# Patient Record
Sex: Female | Born: 2000 | Race: White | Hispanic: No | Marital: Single | Smoking: Current every day smoker
Health system: Southern US, Community
[De-identification: ages and names within clinical notes are randomized; demographics above are authoritative.]

## PROBLEM LIST (undated history)

## (undated) DIAGNOSIS — J45909 Unspecified asthma, uncomplicated: Secondary | ICD-10-CM

---
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
## 2019-04-25 ENCOUNTER — Encounter (HOSPITAL_BASED_OUTPATIENT_CLINIC_OR_DEPARTMENT_OTHER): Payer: Self-pay | Admitting: *Deleted

## 2019-04-25 ENCOUNTER — Other Ambulatory Visit: Payer: Self-pay

## 2019-04-25 ENCOUNTER — Emergency Department (HOSPITAL_BASED_OUTPATIENT_CLINIC_OR_DEPARTMENT_OTHER)
Admission: EM | Admit: 2019-04-25 | Discharge: 2019-04-26 | Disposition: A | Discharge status: Home / Self Care | Payer: Managed Care, Other (non HMO) | Attending: Emergency Medicine | Admitting: Emergency Medicine

## 2019-04-25 ENCOUNTER — Emergency Department (HOSPITAL_BASED_OUTPATIENT_CLINIC_OR_DEPARTMENT_OTHER): Payer: Managed Care, Other (non HMO)

## 2019-04-25 DIAGNOSIS — J9801 Acute bronchospasm: Secondary | ICD-10-CM | POA: Diagnosis not present

## 2019-04-25 DIAGNOSIS — J45909 Unspecified asthma, uncomplicated: Secondary | ICD-10-CM | POA: Insufficient documentation

## 2019-04-25 DIAGNOSIS — F17210 Nicotine dependence, cigarettes, uncomplicated: Secondary | ICD-10-CM | POA: Insufficient documentation

## 2019-04-25 DIAGNOSIS — R0602 Shortness of breath: Secondary | ICD-10-CM | POA: Diagnosis present

## 2019-04-25 HISTORY — DX: Unspecified asthma, uncomplicated: J45.909

## 2019-04-25 LAB — CBC WITH DIFFERENTIAL/PLATELET
Abs Immature Granulocytes: 0.05 10*3/uL (ref 0.00–0.07)
Basophils Absolute: 0.1 10*3/uL (ref 0.0–0.1)
Basophils Relative: 1 %
Eosinophils Absolute: 1.1 10*3/uL — ABNORMAL HIGH (ref 0.0–0.5)
Eosinophils Relative: 9 %
HCT: 44.7 % (ref 36.0–46.0)
Hemoglobin: 15 g/dL (ref 12.0–15.0)
Immature Granulocytes: 0 %
Lymphocytes Relative: 36 %
Lymphs Abs: 4.4 10*3/uL — ABNORMAL HIGH (ref 0.7–4.0)
MCH: 29.2 pg (ref 26.0–34.0)
MCHC: 33.6 g/dL (ref 30.0–36.0)
MCV: 87.1 fL (ref 80.0–100.0)
Monocytes Absolute: 0.8 10*3/uL (ref 0.1–1.0)
Monocytes Relative: 7 %
Neutro Abs: 5.7 10*3/uL (ref 1.7–7.7)
Neutrophils Relative %: 47 %
Platelets: 255 10*3/uL (ref 150–400)
RBC: 5.13 MIL/uL — ABNORMAL HIGH (ref 3.87–5.11)
RDW: 12.3 % (ref 11.5–15.5)
WBC: 12.1 10*3/uL — ABNORMAL HIGH (ref 4.0–10.5)
nRBC: 0 % (ref 0.0–0.2)

## 2019-04-25 LAB — BASIC METABOLIC PANEL
Anion gap: 13 (ref 5–15)
BUN: 13 mg/dL (ref 6–20)
CO2: 22 mmol/L (ref 22–32)
Calcium: 9.4 mg/dL (ref 8.9–10.3)
Chloride: 103 mmol/L (ref 98–111)
Creatinine, Ser: 0.78 mg/dL (ref 0.44–1.00)
GFR calc Af Amer: >60 mL/min (ref >60)
GFR calc non Af Amer: >60 mL/min (ref >60)
Glucose, Bld: 94 mg/dL (ref 70–99)
Potassium: 3.6 mmol/L (ref 3.5–5.1)
Sodium: 138 mmol/L (ref 135–145)

## 2019-04-25 MED ORDER — MAGNESIUM SULFATE 2 GM/50ML IV SOLN
2.0000 g | Freq: Once | INTRAVENOUS | Status: AC
  Administered 2019-04-25: 2 g via INTRAVENOUS
  Filled 2019-04-25: qty 50

## 2019-04-25 MED ORDER — METHYLPREDNISOLONE SODIUM SUCC 125 MG IJ SOLR
125.0000 mg | Freq: Once | INTRAMUSCULAR | Status: AC
  Administered 2019-04-25: 125 mg via INTRAVENOUS
  Filled 2019-04-25: qty 2

## 2019-04-25 MED ORDER — LORAZEPAM 2 MG/ML IJ SOLN
1.0000 mg | Freq: Once | INTRAMUSCULAR | Status: AC
  Administered 2019-04-25: 1 mg via INTRAVENOUS
  Filled 2019-04-25: qty 1

## 2019-04-25 MED ORDER — ALBUTEROL SULFATE HFA 108 (90 BASE) MCG/ACT IN AERS
INHALATION_SPRAY | RESPIRATORY_TRACT | Status: AC
  Administered 2019-04-25: 8 via RESPIRATORY_TRACT
  Filled 2019-04-25: qty 6.7

## 2019-04-25 MED ORDER — ALBUTEROL SULFATE HFA 108 (90 BASE) MCG/ACT IN AERS
8.0000 | INHALATION_SPRAY | Freq: Once | RESPIRATORY_TRACT | Status: AC
  Administered 2019-04-25: 8 via RESPIRATORY_TRACT

## 2019-04-25 NOTE — ED Provider Notes (Signed)
[facility] Provider Note: Mary Spurling, MD, FACEP  CSN: [id number] MRN: [id number] ARRIVAL: 04/25/19 at 2315 ROOM: [place] (asthma)   HISTORY OF PRESENT ILLNESS  04/25/19 11:29 PM Mary Serrano is a 18 y.o. female with a history of asthma.  She has had shortness of breath for the past month, worse over the past 3 days.  She is not on any of her own medications for asthma but has been using a friend's albuterol.  She has been using the inhaler hourly without adequate relief.  Her symptoms have been severe at times.  She has had associated cough with posttussive emesis.  She also has a headache.  She has had no known COVID-19 exposures.  On arrival she was noted to be tachypneic with a respiratory rate of 30 and tachycardic with a pulse rate of 120.   Past Medical History:  Diagnosis Date  . Asthma     History reviewed. No pertinent surgical history.  No family history on file.  Social History   Tobacco Use  . Smoking status: Current Every Day Smoker    Types: Cigarettes  . Smokeless tobacco: Never Used  Substance Use Topics  . Alcohol use: Yes  . Drug use: No    Prior to Admission medications   Medication Sig Start Date End Date Taking? Authorizing Provider  predniSONE (DELTASONE) 50 MG tablet Take 1 tablet (50 mg total) by mouth daily. 04/26/19   Keir Viernes, MD  albuterol (PROVENTIL HFA;VENTOLIN HFA) 108 (90 BASE) MCG/ACT inhaler Inhale 2 puffs into the lungs every 4 (four) hours as needed for wheezing or shortness of breath. 07/26/13 04/26/19  Ward, Delice Bison, DO    Allergies Patient has no known allergies.   REVIEW OF SYSTEMS  Negative except as noted here or in the History of Present Illness.   PHYSICAL EXAMINATION  Initial Vital Signs Pulse (!) 120, temperature 98 F (36.7 C), temperature source Oral, resp. rate (!) 30, height 5\' 4"  (1.626 m), weight 81.6 kg, SpO2 97 %.  Examination General:  Well-developed, well-nourished female in mild distress; appearance consistent with age of record HENT: normocephalic; atraumatic Eyes: pupils equal, round and reactive to light; extraocular muscles intact Neck: supple Heart: regular rate and rhythm; tachycardia Lungs: clear to auscultation bilaterally but shallow breaths; tachypnea Abdomen: soft; nondistended; nontender; bowel sounds present Extremities: No deformity; full range of motion Neurologic: Awake, alert and oriented; motor function intact in all extremities and symmetric; no facial droop Skin: Warm and dry Psychiatric: Anxious   RESULTS  Summary of this visit's results, reviewed by myself:   EKG Interpretation  Date/Time:    Ventricular Rate:    PR Interval:    QRS Duration:   QT Interval:    QTC Calculation:   R Axis:     Text Interpretation:        Laboratory Studies: Results for orders placed or performed during the hospital encounter of 04/25/19 (from the past 24 hour(s))  CBC with Differential/Platelet     Status: Abnormal   Collection Time: 04/25/19 11:35 PM  Result Value Ref Range   WBC 12.1 (H) 4.0 - 10.5 K/uL   RBC 5.13 (H) 3.87 - 5.11 MIL/uL   Hemoglobin 15.0 12.0 - 15.0 g/dL   HCT 44.7 36.0 - 46.0 %   MCV 87.1 80.0 - 100.0 fL   MCH 29.2 26.0 - 34.0 pg   MCHC 33.6 30.0 - 36.0 g/dL   RDW  12.3 11.5 - 15.5 %   Platelets 255 150 - 400 K/uL   nRBC 0.0 0.0 - 0.2 %   Neutrophils Relative % 47 %   Neutro Abs 5.7 1.7 - 7.7 K/uL   Lymphocytes Relative 36 %   Lymphs Abs 4.4 (H) 0.7 - 4.0 K/uL   Monocytes Relative 7 %   Monocytes Absolute 0.8 0.1 - 1.0 K/uL   Eosinophils Relative 9 %   Eosinophils Absolute 1.1 (H) 0.0 - 0.5 K/uL   Basophils Relative 1 %   Basophils Absolute 0.1 0.0 - 0.1 K/uL   Immature Granulocytes 0 %   Abs Immature Granulocytes 0.05 0.00 - 0.07 K/uL  Basic metabolic panel     Status: None   Collection Time: 04/25/19 11:35 PM  Result Value Ref Range   Sodium 138 135 - 145 mmol/L    Potassium 3.6 3.5 - 5.1 mmol/L   Chloride 103 98 - 111 mmol/L   CO2 22 22 - 32 mmol/L   Glucose, Bld 94 70 - 99 mg/dL   BUN 13 6 - 20 mg/dL   Creatinine, Ser 1.610.78 0.44 - 1.00 mg/dL   Calcium 9.4 8.9 - 09.610.3 mg/dL   GFR calc non Af Amer >60 >60 mL/min   GFR calc Af Amer >60 >60 mL/min   Anion gap 13 5 - 15  Pregnancy, urine     Status: None   Collection Time: 04/25/19 11:35 PM  Result Value Ref Range   Preg Test, Ur NEGATIVE NEGATIVE   Imaging Studies: Dg Chest 2 View  Result Date: 04/26/2019 CLINICAL DATA:  Shortness of breath EXAM: CHEST - 2 VIEW COMPARISON:  None. FINDINGS: The heart size and mediastinal contours are within normal limits. Both lungs are clear. The visualized skeletal structures are unremarkable. IMPRESSION: No active cardiopulmonary disease. Electronically Signed   By: Jasmine PangKim  Fujinaga M.D.   On: 04/26/2019 00:06    ED COURSE and MDM  Nursing notes and initial vitals signs, including pulse oximetry, reviewed.  Vitals:   04/25/19 2325 04/25/19 2330 04/25/19 2338 04/26/19 0045  BP:  (!) 122/96    Pulse:  (!) 124  (!) 105  Resp:  (!) 22  (!) 23  Temp:      TempSrc:      SpO2:  95% 99% 96%  Weight: 81.6 kg     Height: 5\' 4"  (1.626 m)      12:55 AM Lungs clear, air movement improved, tachypnea resolved.  Patient states she is ready to go home.  She was given an inhaler and instructed in its use.  PROCEDURES    ED DIAGNOSES     ICD-10-CM   1. Acute bronchospasm  J98.01        Jaye Saal, Jonny RuizJohn, MD 04/26/19 [phone]

## 2019-04-25 NOTE — ED Triage Notes (Addendum)
Pt reports Hx of asthma. Has been using her friend's inhalers and nebulizer. States she has been SOB x 1 month but worse over the last 3 days. She is not on any maintenance medications for her asthma. States she vomited x 3 tonight after a coughing episode

## 2019-04-26 LAB — PREGNANCY, URINE: Preg Test, Ur: NEGATIVE

## 2019-04-26 MED ORDER — ONDANSETRON HCL 4 MG/2ML IJ SOLN: 4.0000 mg | Freq: Once | INTRAMUSCULAR | Status: DC

## 2019-04-26 MED ORDER — PREDNISONE 50 MG PO TABS: 50.0000 mg | ORAL_TABLET | Freq: Every day | ORAL | 0 refills | Status: DC

## 2019-05-23 ENCOUNTER — Encounter (HOSPITAL_BASED_OUTPATIENT_CLINIC_OR_DEPARTMENT_OTHER): Payer: Self-pay | Admitting: Adult Health

## 2019-05-23 ENCOUNTER — Other Ambulatory Visit: Payer: Self-pay

## 2019-05-23 ENCOUNTER — Emergency Department (HOSPITAL_BASED_OUTPATIENT_CLINIC_OR_DEPARTMENT_OTHER)
Admission: EM | Admit: 2019-05-23 | Discharge: 2019-05-23 | Disposition: A | Discharge status: Home / Self Care | Payer: Managed Care, Other (non HMO) | Attending: Emergency Medicine | Admitting: Emergency Medicine

## 2019-05-23 DIAGNOSIS — R0602 Shortness of breath: Secondary | ICD-10-CM | POA: Insufficient documentation

## 2019-05-23 DIAGNOSIS — J029 Acute pharyngitis, unspecified: Secondary | ICD-10-CM | POA: Diagnosis not present

## 2019-05-23 DIAGNOSIS — Z20828 Contact with and (suspected) exposure to other viral communicable diseases: Secondary | ICD-10-CM | POA: Diagnosis not present

## 2019-05-23 DIAGNOSIS — F17210 Nicotine dependence, cigarettes, uncomplicated: Secondary | ICD-10-CM | POA: Diagnosis not present

## 2019-05-23 DIAGNOSIS — J4521 Mild intermittent asthma with (acute) exacerbation: Secondary | ICD-10-CM | POA: Diagnosis not present

## 2019-05-23 DIAGNOSIS — R05 Cough: Secondary | ICD-10-CM | POA: Diagnosis not present

## 2019-05-23 DIAGNOSIS — R0981 Nasal congestion: Secondary | ICD-10-CM | POA: Insufficient documentation

## 2019-05-23 DIAGNOSIS — R070 Pain in throat: Secondary | ICD-10-CM | POA: Diagnosis present

## 2019-05-23 LAB — GROUP A STREP BY PCR: Group A Strep by PCR: NOT DETECTED

## 2019-05-23 MED ORDER — ALBUTEROL SULFATE HFA 108 (90 BASE) MCG/ACT IN AERS
INHALATION_SPRAY | RESPIRATORY_TRACT | Status: AC
  Filled 2019-05-23: qty 6.7

## 2019-05-23 MED ORDER — ALBUTEROL SULFATE HFA 108 (90 BASE) MCG/ACT IN AERS
4.0000 | INHALATION_SPRAY | Freq: Once | RESPIRATORY_TRACT | Status: AC
  Administered 2019-05-23: 16:00:00 4 via RESPIRATORY_TRACT

## 2019-05-23 NOTE — ED Provider Notes (Signed)
[name] EMERGENCY DEPARTMENT Provider Note   CSN: [id number] Arrival date & time: 05/23/19  1530     History   Chief Complaint Chief Complaint  Patient presents with  . Sore Throat  . Shortness of Breath    HPI Mary Serrano is a 18 y.o. female.     Patient with history of asthma presents with 2 days of shortness of breath, wheezing, sore throat.  Patient says that she is gargle with salt water for the sore throat.  She has done nothing else for her shortness of breath or sore throat.  Also reports cough, congestion.  Denies any fevers, sick contacts.     Past Medical History:  Diagnosis Date  . Asthma     There are no active problems to display for this patient.   History reviewed. No pertinent surgical history.   OB History   No obstetric history on file.      Home Medications    Prior to Admission medications   Medication Sig Start Date End Date Taking? Authorizing Provider  predniSONE (DELTASONE) 50 MG tablet Take 1 tablet (50 mg total) by mouth daily. 04/26/19   Serrano, John, MD  albuterol (PROVENTIL HFA;VENTOLIN HFA) 108 (90 BASE) MCG/ACT inhaler Inhale 2 puffs into the lungs every 4 (four) hours as needed for wheezing or shortness of breath. 07/26/13 04/26/19  Serrano, Mary Bison, DO    Family History History reviewed. No pertinent family history.  Social History Social History   Tobacco Use  . Smoking status: Current Every Day Smoker    Types: Cigarettes  . Smokeless tobacco: Never Used  Substance Use Topics  . Alcohol use: Yes  . Drug use: No     Allergies   Penicillins   Review of Systems Review of Systems As per HPI  Physical Exam Updated Vital Signs BP 102/80   Pulse 100   Temp 97.8 F (36.6 C) (Oral)   Resp (!) 26   Ht 5\' 4"  (1.626 m)   SpO2 98%   BMI 30.90 kg/m   Physical Exam Vitals signs reviewed.  Constitutional:      General: She is not in acute distress.    Appearance: She is well-developed and normal  weight.  HENT:     Head: Normocephalic and atraumatic.     Nose: Congestion present.     Mouth/Throat:     Mouth: Mucous membranes are moist.     Pharynx: Uvula midline. Posterior oropharyngeal erythema present. No uvula swelling.     Tonsils: No tonsillar exudate or tonsillar abscesses.  Eyes:     Conjunctiva/sclera: Conjunctivae normal.  Neck:     Musculoskeletal: Normal range of motion.  Cardiovascular:     Rate and Rhythm: Normal rate and regular rhythm.  Pulmonary:     Effort: Pulmonary effort is normal.     Breath sounds: Normal breath sounds.  Abdominal:     General: There is no distension.     Palpations: Abdomen is soft.     Tenderness: There is no abdominal tenderness. There is no rebound.  Skin:    General: Skin is warm and dry.  Neurological:     General: No focal deficit present.     Mental Status: She is alert and oriented to person, place, and time.  Psychiatric:        Mood and Affect: Mood normal.      ED Treatments / Results  Labs (all labs ordered are listed, but only abnormal results are  displayed) Labs Reviewed  GROUP A STREP BY PCR  SARS CORONAVIRUS 2 (TAT 6-24 HRS)    EKG None  Radiology No results found.  Procedures Procedures (including critical care time)  Medications Ordered in ED Medications  albuterol (VENTOLIN HFA) 108 (90 Base) MCG/ACT inhaler (has no administration in time range)  albuterol (VENTOLIN HFA) 108 (90 Base) MCG/ACT inhaler 4 puff (4 puffs Inhalation Given 05/23/19 1555)     Initial Impression / Assessment and Plan / ED Course  I have reviewed the triage vital signs and the nursing notes.  Pertinent labs & imaging results that were available during my care of the patient were reviewed by me and considered in my medical decision making (see chart for details).       Patient with history of asthma and presents with 2 days of shortness of breath and sore throat.  Rapid strep was negative.  Patient received  albuterol inhaler while waiting for ED room placement.  Patient well-appearing, saturating well on room air.  No wheeze on exam.  Likely having mild asthma exacerbation.  Possibly viral driven with pharyngitis and rhinitis symptoms.  Recommend COVID-19 testing with quarantine precautions given.  Recommend albuterol inhaler 2 to 4 puffs every 4 hours as needed for pain.  Recommend OTC analgesics as needed for throat pain.  Return precautions discussed.    Final Clinical Impressions(s) / ED Diagnoses   Final diagnoses:  Viral pharyngitis  Mild intermittent asthma with exacerbation    ED Discharge Orders    None       Mary Gunner, MD 05/23/19 1736    Mary Pander, MD 05/24/19 2100

## 2019-05-23 NOTE — ED Notes (Signed)
Resident MD at bedside.

## 2019-05-23 NOTE — ED Triage Notes (Signed)
Presents with SOB that has been going on for the past few days, She is using nebs and and albuterol inhaler without relief. She also reports a sore throat. HEr throat is red.

## 2019-05-23 NOTE — ED Notes (Signed)
ED Provider at bedside. Dr. Yao 

## 2019-05-23 NOTE — ED Notes (Signed)
Given note for work

## 2019-05-24 LAB — SARS CORONAVIRUS 2 (TAT 6-24 HRS): SARS Coronavirus 2: NEGATIVE

## 2019-10-20 ENCOUNTER — Emergency Department (HOSPITAL_BASED_OUTPATIENT_CLINIC_OR_DEPARTMENT_OTHER)
Admission: EM | Admit: 2019-10-20 | Discharge: 2019-10-20 | Disposition: A | Discharge status: Home / Self Care | Payer: Self-pay | Attending: Emergency Medicine | Admitting: Emergency Medicine

## 2019-10-20 ENCOUNTER — Emergency Department (HOSPITAL_BASED_OUTPATIENT_CLINIC_OR_DEPARTMENT_OTHER): Payer: Self-pay

## 2019-10-20 ENCOUNTER — Other Ambulatory Visit: Payer: Self-pay

## 2019-10-20 ENCOUNTER — Encounter (HOSPITAL_BASED_OUTPATIENT_CLINIC_OR_DEPARTMENT_OTHER): Payer: Self-pay

## 2019-10-20 DIAGNOSIS — J45909 Unspecified asthma, uncomplicated: Secondary | ICD-10-CM | POA: Insufficient documentation

## 2019-10-20 DIAGNOSIS — J069 Acute upper respiratory infection, unspecified: Secondary | ICD-10-CM | POA: Insufficient documentation

## 2019-10-20 DIAGNOSIS — Z79899 Other long term (current) drug therapy: Secondary | ICD-10-CM | POA: Insufficient documentation

## 2019-10-20 DIAGNOSIS — F17210 Nicotine dependence, cigarettes, uncomplicated: Secondary | ICD-10-CM | POA: Insufficient documentation

## 2019-10-20 DIAGNOSIS — Z20822 Contact with and (suspected) exposure to covid-19: Secondary | ICD-10-CM | POA: Insufficient documentation

## 2019-10-20 DIAGNOSIS — R05 Cough: Secondary | ICD-10-CM | POA: Insufficient documentation

## 2019-10-20 DIAGNOSIS — R059 Cough, unspecified: Secondary | ICD-10-CM

## 2019-10-20 LAB — SARS CORONAVIRUS 2 AG (30 MIN TAT): SARS Coronavirus 2 Ag: NEGATIVE

## 2019-10-20 MED ORDER — BENZONATATE 100 MG PO CAPS: 100.0000 mg | ORAL_CAPSULE | Freq: Three times a day (TID) | ORAL | 0 refills | Status: DC

## 2019-10-20 MED ORDER — PREDNISONE 50 MG PO TABS
60.0000 mg | ORAL_TABLET | Freq: Once | ORAL | Status: AC
  Administered 2019-10-20: 23:00:00 60 mg via ORAL
  Filled 2019-10-20: qty 1

## 2019-10-20 NOTE — ED Provider Notes (Signed)
[facility] EMERGENCY DEPARTMENT Provider Note   CSN: [id number] Arrival date & time: 10/20/19  2128     History Chief Complaint  Patient presents with  . Cough    Mary Serrano is a 19 y.o. female with PMhx asthma who presents to the ED today complaining of gradual onset, constant, sore throat x 2 days. Pt also complains of a productive cough, post tussive emesis, and chills/hot flashes. Pt reports that both her mom and sister are starting to feel a "tickle in their throats." Pt denies recent sick contact or COVID 19 positive exposure. Pt is unsure if she has had a fever. She has been using her albuterol inhaler today without relief. Denies chest pain, shortness of breath, abdominal pain, diarrhea, difficulty swallowing, or any other associated symptoms.   The history is provided by the patient and medical records.       Past Medical History:  Diagnosis Date  . Asthma     There are no problems to display for this patient.   History reviewed. No pertinent surgical history.   OB History   No obstetric history on file.     No family history on file.  Social History   Tobacco Use  . Smoking status: Current Every Day Smoker    Types: Cigarettes  . Smokeless tobacco: Never Used  Substance Use Topics  . Alcohol use: Yes    Comment: occ  . Drug use: No    Home Medications Prior to Admission medications   Medication Sig Start Date End Date Taking? Authorizing Provider  benzonatate (TESSALON) 100 MG capsule Take 1 capsule (100 mg total) by mouth every 8 (eight) hours. 10/20/19   Hyman Hopes, Jamy Cleckler, PA-C  predniSONE (DELTASONE) 50 MG tablet Take 1 tablet (50 mg total) by mouth daily. 04/26/19   Molpus, John, MD  albuterol (PROVENTIL HFA;VENTOLIN HFA) 108 (90 BASE) MCG/ACT inhaler Inhale 2 puffs into the lungs every 4 (four) hours as needed for wheezing or shortness of breath. 07/26/13 04/26/19  Ward, Layla Maw, DO    Allergies    Penicillins  Review of Systems     Review of Systems  Constitutional: Positive for chills and fatigue. Negative for fever.  HENT: Positive for sore throat. Negative for trouble swallowing and voice change.   Respiratory: Positive for cough and wheezing.   Cardiovascular: Negative for chest pain.  Gastrointestinal: Negative for abdominal pain, diarrhea and nausea.  All other systems reviewed and are negative.   Physical Exam Updated Vital Signs BP 117/83 (BP Location: Left Arm)   Pulse (!) 102   Temp 98.2 F (36.8 C) (Oral)   Resp (!) 24   Ht 5\' 6"  (1.676 m)   Wt 88.9 kg   LMP 10/16/2019   SpO2 96%   BMI 31.64 kg/m   Physical Exam Vitals and nursing note reviewed.  Constitutional:      Appearance: She is not diaphoretic.     Comments: Actively coughing in room  HENT:     Head: Normocephalic and atraumatic.     Right Ear: Tympanic membrane normal.     Left Ear: Tympanic membrane normal.     Mouth/Throat:     Mouth: Mucous membranes are moist.     Pharynx: Posterior oropharyngeal erythema present. No oropharyngeal exudate.  Eyes:     Conjunctiva/sclera: Conjunctivae normal.  Cardiovascular:     Rate and Rhythm: Regular rhythm. Tachycardia present.  Pulmonary:     Effort: Pulmonary effort is normal.  Breath sounds: Wheezing present. No rhonchi or rales.     Comments: End expiratory wheezes. Pt actively coughing but able to speak in short sentences in between the coughing spells. Satting 96% on RA.  Abdominal:     Palpations: Abdomen is soft.     Tenderness: There is no abdominal tenderness. There is no guarding or rebound.  Musculoskeletal:     Cervical back: Neck supple.  Skin:    General: Skin is warm and dry.  Neurological:     Mental Status: She is alert.     ED Results / Procedures / Treatments   Labs (all labs ordered are listed, but only abnormal results are displayed) Labs Reviewed  SARS CORONAVIRUS 2 AG (30 MIN TAT)  SARS CORONAVIRUS 2 (TAT 6-24 HRS)     EKG None  Radiology DG Chest Port 1 View  Result Date: 10/20/2019 CLINICAL DATA:  Cough and sore throat x2 days. EXAM: PORTABLE CHEST 1 VIEW COMPARISON:  April 26, 2019 FINDINGS: The heart size and mediastinal contours are within normal limits. Both lungs are clear. The visualized skeletal structures are unremarkable. IMPRESSION: No active disease. Electronically Signed   By: Virgina Norfolk M.D.   On: 10/20/2019 22:43    Procedures Procedures (including critical care time)  Medications Ordered in ED Medications  predniSONE (DELTASONE) tablet 60 mg (60 mg Oral Given 10/20/19 2239)    ED Course  I have reviewed the triage vital signs and the nursing notes.  Pertinent labs & imaging results that were available during my care of the patient were reviewed by me and considered in my medical decision making (see chart for details).  19 year old female with past medical history of asthma who presents with cough, sore throat, fatigue, chills, posttussive emesis x2 days.  Mom and sister are beginning to have similar symptoms.  Actively coughing in room and gagging.  Has no abdominal tenderness on exam.  Suspect she is triggering her gag reflex with the amount of coughing she is doing which is causing the posttussive emesis.  She has been using her albuterol inhaler without relief.  Will give prednisone orally and reevaluate.  Chest x-ray and Covid swab obtained.  On reeval after prednisone pt feels like she can breathe better. Wheezing has improved. CXR clear.   Rapid COVID test negative. Will reswab. Pt discharged at this time with tessalon perles. Advised to continue using albuterol inhaler. Strict return precautions discussed including worsening cough, worsening shortness of breath, or any other concerning symptoms. Pt is in agreement with plan and stable for discharge home.   Clinical Course as of Oct 19 2304  Tue Oct 20, 2019  2303 SARS Coronavirus 2 Ag: NEGATIVE [MV]     Clinical Course User Index [MV] Eustaquio Maize, PA-C   This note was prepared using Dragon voice recognition software and may include unintentional dictation errors due to the inherent limitations of voice recognition software.  Mary Serrano was evaluated in Emergency Department on 10/20/2019 for the symptoms described in the history of present illness. She was evaluated in the context of the global COVID-19 pandemic, which necessitated consideration that the patient might be at risk for infection with the SARS-CoV-2 virus that causes COVID-19. Institutional protocols and algorithms that pertain to the evaluation of patients at risk for COVID-19 are in a state of rapid change based on information released by regulatory bodies including the CDC and federal and state organizations. These policies and algorithms were followed during the patient's care in the ED.  MDM Rules/Calculators/A&P                       Final Clinical Impression(s) / ED Diagnoses Final diagnoses:  Cough  Viral upper respiratory tract infection    Rx / DC Orders ED Discharge Orders         Ordered    benzonatate (TESSALON) 100 MG capsule  Every 8 hours     10/20/19 2258           Discharge Instructions     Your rapid COVID test was negative. We have reswabbed you with a send out test which is more sensitive - please stay at home and self isolate until you receive your results If positive we will call with the results. If you do not hear from [place] by Thursday night/Friday morning please log into your mychart and check the results that way.  If positive you will need to self isolate for 14 days and are cleared: 3/31 Pick up medication to help with your coughing. Continue using your albuterol inhaler as needed        Tanda Rockers, Cordelia Poche 10/20/19 2307    Vanetta Mulders, MD 10/23/19 [phone]

## 2019-10-20 NOTE — Discharge Instructions (Addendum)
Your rapid COVID test was negative. We have reswabbed you with a send out test which is more sensitive - please stay at home and self isolate until you receive your results If positive we will call with the results. If you do not hear from [place] by Thursday night/Friday morning please log into your mychart and check the results that way.  If positive you will need to self isolate for 14 days and are cleared: 3/31 Pick up medication to help with your coughing. Continue using your albuterol inhaler as needed

## 2019-10-20 NOTE — ED Triage Notes (Addendum)
Pt c/o cough, sore throat x 2 days-n/v x today-last used albuterol inhaler 2 hours PTA-NAD-constant cough noted-steady gait

## 2019-10-20 NOTE — ED Notes (Signed)
ED Provider at bedside. 

## 2019-10-21 LAB — SARS CORONAVIRUS 2 (TAT 6-24 HRS): SARS Coronavirus 2: NEGATIVE

## 2020-03-18 ENCOUNTER — Emergency Department (HOSPITAL_BASED_OUTPATIENT_CLINIC_OR_DEPARTMENT_OTHER)
Admission: EM | Admit: 2020-03-18 | Discharge: 2020-03-19 | Disposition: A | Discharge status: Home / Self Care | Payer: Self-pay | Attending: Emergency Medicine | Admitting: Emergency Medicine

## 2020-03-18 ENCOUNTER — Encounter (HOSPITAL_BASED_OUTPATIENT_CLINIC_OR_DEPARTMENT_OTHER): Payer: Self-pay | Admitting: Emergency Medicine

## 2020-03-18 ENCOUNTER — Emergency Department (HOSPITAL_BASED_OUTPATIENT_CLINIC_OR_DEPARTMENT_OTHER): Payer: Self-pay

## 2020-03-18 ENCOUNTER — Other Ambulatory Visit: Payer: Self-pay

## 2020-03-18 DIAGNOSIS — R591 Generalized enlarged lymph nodes: Secondary | ICD-10-CM

## 2020-03-18 DIAGNOSIS — R0602 Shortness of breath: Secondary | ICD-10-CM | POA: Insufficient documentation

## 2020-03-18 DIAGNOSIS — F17210 Nicotine dependence, cigarettes, uncomplicated: Secondary | ICD-10-CM | POA: Insufficient documentation

## 2020-03-18 DIAGNOSIS — R079 Chest pain, unspecified: Secondary | ICD-10-CM | POA: Insufficient documentation

## 2020-03-18 DIAGNOSIS — R5383 Other fatigue: Secondary | ICD-10-CM | POA: Insufficient documentation

## 2020-03-18 DIAGNOSIS — J4521 Mild intermittent asthma with (acute) exacerbation: Secondary | ICD-10-CM

## 2020-03-18 DIAGNOSIS — R509 Fever, unspecified: Secondary | ICD-10-CM | POA: Insufficient documentation

## 2020-03-18 DIAGNOSIS — R112 Nausea with vomiting, unspecified: Secondary | ICD-10-CM

## 2020-03-18 DIAGNOSIS — Z7951 Long term (current) use of inhaled steroids: Secondary | ICD-10-CM | POA: Insufficient documentation

## 2020-03-18 DIAGNOSIS — Z20822 Contact with and (suspected) exposure to covid-19: Secondary | ICD-10-CM | POA: Insufficient documentation

## 2020-03-18 DIAGNOSIS — B349 Viral infection, unspecified: Secondary | ICD-10-CM

## 2020-03-18 DIAGNOSIS — J45909 Unspecified asthma, uncomplicated: Secondary | ICD-10-CM | POA: Insufficient documentation

## 2020-03-18 DIAGNOSIS — R109 Unspecified abdominal pain: Secondary | ICD-10-CM | POA: Insufficient documentation

## 2020-03-18 DIAGNOSIS — R05 Cough: Secondary | ICD-10-CM | POA: Insufficient documentation

## 2020-03-18 DIAGNOSIS — J029 Acute pharyngitis, unspecified: Secondary | ICD-10-CM

## 2020-03-18 DIAGNOSIS — R059 Cough, unspecified: Secondary | ICD-10-CM

## 2020-03-18 LAB — CBC WITH DIFFERENTIAL/PLATELET
Abs Immature Granulocytes: 0.03 10*3/uL (ref 0.00–0.07)
Basophils Absolute: 0.1 10*3/uL (ref 0.0–0.1)
Basophils Relative: 1 %
Eosinophils Absolute: 0.6 10*3/uL — ABNORMAL HIGH (ref 0.0–0.5)
Eosinophils Relative: 7 %
HCT: 47.2 % — ABNORMAL HIGH (ref 36.0–46.0)
Hemoglobin: 16 g/dL — ABNORMAL HIGH (ref 12.0–15.0)
Immature Granulocytes: 0 %
Lymphocytes Relative: 28 %
Lymphs Abs: 2.2 10*3/uL (ref 0.7–4.0)
MCH: 28.7 pg (ref 26.0–34.0)
MCHC: 33.9 g/dL (ref 30.0–36.0)
MCV: 84.7 fL (ref 80.0–100.0)
Monocytes Absolute: 0.7 10*3/uL (ref 0.1–1.0)
Monocytes Relative: 9 %
Neutro Abs: 4.5 10*3/uL (ref 1.7–7.7)
Neutrophils Relative %: 55 %
Platelets: 249 10*3/uL (ref 150–400)
RBC: 5.57 MIL/uL — ABNORMAL HIGH (ref 3.87–5.11)
RDW: 12.9 % (ref 11.5–15.5)
WBC: 8.1 10*3/uL (ref 4.0–10.5)
nRBC: 0 % (ref 0.0–0.2)

## 2020-03-18 LAB — COMPREHENSIVE METABOLIC PANEL
ALT: 26 U/L (ref 0–44)
AST: 31 U/L (ref 15–41)
Albumin: 4.5 g/dL (ref 3.5–5.0)
Alkaline Phosphatase: 69 U/L (ref 38–126)
Anion gap: 13 (ref 5–15)
BUN: 13 mg/dL (ref 6–20)
CO2: 20 mmol/L — ABNORMAL LOW (ref 22–32)
Calcium: 9.5 mg/dL (ref 8.9–10.3)
Chloride: 104 mmol/L (ref 98–111)
Creatinine, Ser: 0.85 mg/dL (ref 0.44–1.00)
GFR calc Af Amer: >60 mL/min (ref >60)
GFR calc non Af Amer: >60 mL/min (ref >60)
Glucose, Bld: 134 mg/dL — ABNORMAL HIGH (ref 70–99)
Potassium: 3.1 mmol/L — ABNORMAL LOW (ref 3.5–5.1)
Sodium: 137 mmol/L (ref 135–145)
Total Bilirubin: 0.6 mg/dL (ref 0.3–1.2)
Total Protein: 8 g/dL (ref 6.5–8.1)

## 2020-03-18 LAB — D-DIMER, QUANTITATIVE: D-Dimer, Quant: 0.45 ug/mL-FEU (ref 0.00–0.50)

## 2020-03-18 LAB — URINALYSIS, ROUTINE W REFLEX MICROSCOPIC
Bilirubin Urine: NEGATIVE
Glucose, UA: NEGATIVE mg/dL
Hgb urine dipstick: NEGATIVE
Ketones, ur: NEGATIVE mg/dL
Leukocytes,Ua: NEGATIVE
Nitrite: NEGATIVE
Protein, ur: NEGATIVE mg/dL
Specific Gravity, Urine: <1.005 — ABNORMAL LOW (ref 1.005–1.030)
pH: 5.5 (ref 5.0–8.0)

## 2020-03-18 LAB — LACTIC ACID, PLASMA: Lactic Acid, Venous: 1.9 mmol/L (ref 0.5–1.9)

## 2020-03-18 LAB — SARS CORONAVIRUS 2 BY RT PCR (HOSPITAL ORDER, PERFORMED IN ~~LOC~~ HOSPITAL LAB): SARS Coronavirus 2: NEGATIVE

## 2020-03-18 LAB — RESP PANEL BY RT PCR (RSV, FLU A&B, COVID)
Influenza A by PCR: NEGATIVE
Influenza B by PCR: NEGATIVE
Respiratory Syncytial Virus by PCR: NEGATIVE
SARS Coronavirus 2 by RT PCR: NEGATIVE

## 2020-03-18 LAB — HCG, QUANTITATIVE, PREGNANCY: hCG, Beta Chain, Quant, S: <1 m[IU]/mL (ref <5)

## 2020-03-18 LAB — TROPONIN I (HIGH SENSITIVITY): Troponin I (High Sensitivity): <2 ng/L (ref <18)

## 2020-03-18 MED ORDER — ALBUTEROL SULFATE HFA 108 (90 BASE) MCG/ACT IN AERS
INHALATION_SPRAY | RESPIRATORY_TRACT | Status: AC
  Administered 2020-03-18: 8
  Filled 2020-03-18: qty 6.7

## 2020-03-18 MED ORDER — SODIUM CHLORIDE 0.9 % IV BOLUS
1000.0000 mL | Freq: Once | INTRAVENOUS | Status: AC
  Administered 2020-03-18: 1000 mL via INTRAVENOUS

## 2020-03-18 MED ORDER — IOHEXOL 350 MG/ML SOLN
100.0000 mL | Freq: Once | INTRAVENOUS | Status: AC | PRN
  Administered 2020-03-18: 100 mL via INTRAVENOUS

## 2020-03-18 MED ORDER — ONDANSETRON HCL 4 MG/2ML IJ SOLN
4.0000 mg | Freq: Once | INTRAMUSCULAR | Status: AC
  Administered 2020-03-18: 4 mg via INTRAVENOUS
  Filled 2020-03-18: qty 2

## 2020-03-18 MED ORDER — METHYLPREDNISOLONE SODIUM SUCC 125 MG IJ SOLR
125.0000 mg | Freq: Once | INTRAMUSCULAR | Status: AC
  Administered 2020-03-18: 125 mg via INTRAVENOUS
  Filled 2020-03-18: qty 2

## 2020-03-18 NOTE — ED Provider Notes (Signed)
[facility] EMERGENCY DEPARTMENT Provider Note   CSN: [id number] Arrival date & time: 03/18/20  1716     History Chief Complaint  Patient presents with  . Cough    Mary Serrano is a 19 y.o. female with past medical history of asthma who presents for evaluation of 4 days of cough, chest pain, generalized abdominal pain, wheezing, difficulty breathing, subjective fever and chills at home, fatigue.  Patient reports that she has continuously coughed over the last 4 days.  She states it is productive of phlegm.  No hemoptysis.  She states she has had episodes of posttussive emesis.  She states she has not had any other vomiting otherwise but states she has not wanted to eat or drink anything.  She reported that her chest and her abdomen started hurting after she started coughing.  She states particularly when she coughs, her whole abdomen hurts and feels sore.  Her chest is tight and does feel worse when she takes a deep breath in.  She also reports that it is worse with coughing.  She states she has had generalized fever/chills at home but has not measured any temperature at home.  She states she has been wheezing.  She has been using her inhalers with minimal improvement but today, felt like things were getting worse.  She has not had much of an appetite and reports decreased urine output.  She denies any diarrhea.  She does not smoke. She denies any OCP use, recent immobilization, prior history of DVT/PE, recent surgery, leg swelling, or long travel.  She thinks she may have been hospitalized with asthma as a child but does not recall exactly if she was intubated or not.  No hospitalizations as an adult.  The history is provided by the patient.       Past Medical History:  Diagnosis Date  . Asthma     There are no problems to display for this patient.   History reviewed. No pertinent surgical history.   OB History   No obstetric history on file.     No family history on  file.  Social History   Tobacco Use  . Smoking status: Current Every Day Smoker    Types: Cigarettes  . Smokeless tobacco: Never Used  Vaping Use  . Vaping Use: Never used  Substance Use Topics  . Alcohol use: Yes    Comment: occ  . Drug use: No    Home Medications Prior to Admission medications   Medication Sig Start Date End Date Taking? Authorizing Provider  benzonatate (TESSALON) 100 MG capsule Take 1 capsule (100 mg total) by mouth every 8 (eight) hours. 10/20/19   Hyman Hopes, Margaux, PA-C  predniSONE (DELTASONE) 50 MG tablet Take 1 tablet (50 mg total) by mouth daily. 04/26/19   Molpus, John, MD  albuterol (PROVENTIL HFA;VENTOLIN HFA) 108 (90 BASE) MCG/ACT inhaler Inhale 2 puffs into the lungs every 4 (four) hours as needed for wheezing or shortness of breath. 07/26/13 04/26/19  Ward, Layla Maw, DO    Allergies    Penicillins  Review of Systems   Review of Systems  Constitutional: Positive for activity change, appetite change, chills, fatigue and fever.  Respiratory: Positive for cough, chest tightness and shortness of breath.   Cardiovascular: Positive for chest pain. Negative for leg swelling.  Gastrointestinal: Positive for abdominal pain, nausea and vomiting. Negative for diarrhea.  Genitourinary: Positive for decreased urine volume. Negative for dysuria and hematuria.  Neurological: Negative for headaches.  All other  systems reviewed and are negative.   Physical Exam Updated Vital Signs BP 119/81 Comment: 2l  Pulse (!) 101   Temp 98.5 F (36.9 C) (Oral)   Resp 13 Comment: 2l  Wt 88.9 kg   LMP 02/16/2020 (Approximate)   SpO2 97%   BMI 31.63 kg/m   Physical Exam Vitals and nursing note reviewed.  Constitutional:      Appearance: Normal appearance. She is well-developed. She is diaphoretic.     Comments: Appears uncomfortable, diaphoretic.  Intermittently coughing.  HENT:     Head: Normocephalic and atraumatic.  Eyes:     General: Lids are normal.      Conjunctiva/sclera: Conjunctivae normal.     Pupils: Pupils are equal, round, and reactive to light.  Cardiovascular:     Rate and Rhythm: Regular rhythm. Tachycardia present.     Pulses: Normal pulses.     Heart sounds: Normal heart sounds. No murmur heard.  No friction rub. No gallop.   Pulmonary:     Effort: Tachypnea present.     Breath sounds: Decreased air movement present. Wheezing and rales present.     Comments: Patient and is intermittently coughing.  She is tachypneic, with increased work of breathing.  She does have decreased air movement noted diffusely as well as wheezing noted.  Appears to have rales in the right lower lung fields. Abdominal:     Palpations: Abdomen is soft. Abdomen is not rigid.     Tenderness: There is generalized abdominal tenderness. There is no guarding.     Comments: Abdomen is soft, nondistended.  She does have some generalized tenderness noted.  No rigidity but she does have some voluntary guarding.  No focal point of tenderness.  Musculoskeletal:        General: Normal range of motion.     Cervical back: Full passive range of motion without pain.     Comments: Bilateral lower extremities are symmetric in appearance without any overlying warmth, erythema, edema.  Skin:    General: Skin is warm.     Capillary Refill: Capillary refill takes less than 2 seconds.  Neurological:     Mental Status: She is alert and oriented to person, place, and time.  Psychiatric:        Speech: Speech normal.     ED Results / Procedures / Treatments   Labs (all labs ordered are listed, but only abnormal results are displayed) Labs Reviewed  CBC WITH DIFFERENTIAL/PLATELET - Abnormal; Notable for the following components:      Result Value   RBC 5.57 (*)    Hemoglobin 16.0 (*)    HCT 47.2 (*)    Eosinophils Absolute 0.6 (*)    All other components within normal limits  COMPREHENSIVE METABOLIC PANEL - Abnormal; Notable for the following components:   Potassium  3.1 (*)    CO2 20 (*)    Glucose, Bld 134 (*)    All other components within normal limits  SARS CORONAVIRUS 2 BY RT PCR (HOSPITAL ORDER, PERFORMED IN [facility])  CULTURE, BLOOD (ROUTINE X 2)  CULTURE, BLOOD (ROUTINE X 2)  LACTIC ACID, PLASMA  HCG, QUANTITATIVE, PREGNANCY  D-DIMER, QUANTITATIVE (NOT AT [facility])  LACTIC ACID, PLASMA  PREGNANCY, URINE  URINALYSIS, ROUTINE W REFLEX MICROSCOPIC  TROPONIN I (HIGH SENSITIVITY)    EKG EKG Interpretation  Date/Time:  Friday March 18 2020 17:59:55 EDT Ventricular Rate:  147 PR Interval:    QRS Duration: 90 QT Interval:  321 QTC Calculation:  502 R Axis:   76 Text Interpretation: Sinus tachycardia Multiform ventricular premature complexes Low voltage, precordial leads Abnormal T, consider ischemia, inferior leads Prolonged QT interval Baseline wander in lead(s) I II III aVR aVL aVF V1 V2 V3 V4 V5 V6 No previous ECGs available Confirmed by Alvira Monday ([id number]) on 03/18/2020 6:38:38 PM   Radiology No results found.  Procedures Procedures (including critical care time)  Medications Ordered in ED Medications  albuterol (VENTOLIN HFA) 108 (90 Base) MCG/ACT inhaler (8 puffs  Given by Other 03/18/20 1803)  sodium chloride 0.9 % bolus 1,000 mL (1,000 mLs Intravenous New Bag/Given 03/18/20 1812)  ondansetron (ZOFRAN) injection 4 mg (4 mg Intravenous Given 03/18/20 1812)  methylPREDNISolone sodium succinate (SOLU-MEDROL) 125 mg/2 mL injection 125 mg (125 mg Intravenous Given 03/18/20 1814)    ED Course  I have reviewed the triage vital signs and the nursing notes.  Pertinent labs & imaging results that were available during my care of the patient were reviewed by me and considered in my medical decision making (see chart for details).    MDM Rules/Calculators/A&P                          19 year old female with past medical story of asthma who presents for evaluation of cough, shortness of breath, chest pain, abdominal  pain, posttussive emesis that has been ongoing for the last 3 to 4 days.  She did not get Covid vaccinated.  No known COVID-19 exposure.  She does have is asthma and feels like she has been wheezing.  She has used her inhalers with minimal improvement.  On initial ED arrival, she is very uncomfortable appearing, she tachycardic, tachypneic, with increased work of breathing.  She is very diaphoretic.  On exam, she does have wheezing, decreased air movement noted as well as rales and of the right lower lung field.  Abdomen shows diffuse tenderness with some voluntary guarding.  No rigidity.  Question if this is infectious in nature such as COVID-19.  She did not get vaccinated.  Also question asthma exacerbation versus pneumonia.  I suspect her abdominal pain is most likely from coughing so much.  We will plan to check labs, EKG, chest x-ray, KUB, Covid.  D-dimer negative.  Beta quant is negative.  CMP shows potassium of 3.1, BUN and creatinine of 13, 0.85.  Lactic is negative at 1.9.  CBC shows no leukocytosis or anemia.  RN informing that patient became hypoxic with O2 sats between 86% - 89% on room air.  She was placed on 2 L O2.  Patient signed out to Tanda Rockers, PA-C with imaging and COVID test pending.   Portions of this note were generated with Scientist, clinical (histocompatibility and immunogenetics). Dictation errors may occur despite best attempts at proofreading.  Final Clinical Impression(s) / ED Diagnoses Final diagnoses:  None    Rx / DC Orders ED Discharge Orders    None       Rosana Hoes 03/18/20 1911    Alvira Monday, MD 03/19/20 1515

## 2020-03-18 NOTE — ED Notes (Signed)
Imaging after preg test results

## 2020-03-18 NOTE — ED Notes (Signed)
Pt placed 2L nasal oxygen, was not wearing upon entry to room earlier

## 2020-03-18 NOTE — ED Triage Notes (Addendum)
Cough sore throat , stomach pain. For 3 days. Afebrile. Coughing in triage. Airway intact. Increased RR and HR.

## 2020-03-18 NOTE — ED Notes (Signed)
Patient on 2L [place] with oxygen saturations of 97%. Patient sitting up in bed. Patient on monitor. Will continue to monitor and make assessments as needed.

## 2020-03-19 ENCOUNTER — Telehealth (HOSPITAL_BASED_OUTPATIENT_CLINIC_OR_DEPARTMENT_OTHER): Payer: Self-pay | Admitting: Emergency Medicine

## 2020-03-19 LAB — BLOOD CULTURE ID PANEL (REFLEXED) - BCID2

## 2020-03-19 MED ORDER — POTASSIUM CHLORIDE CRYS ER 20 MEQ PO TBCR
40.0000 meq | EXTENDED_RELEASE_TABLET | Freq: Once | ORAL | Status: AC
  Administered 2020-03-19: 40 meq via ORAL
  Filled 2020-03-19: qty 2

## 2020-03-19 MED ORDER — ONDANSETRON 4 MG PO TBDP: 4.0000 mg | ORAL_TABLET | Freq: Three times a day (TID) | ORAL | 0 refills | Status: AC | PRN

## 2020-03-19 MED ORDER — PREDNISONE 10 MG PO TABS: 40.0000 mg | ORAL_TABLET | Freq: Every day | ORAL | 0 refills | Status: AC

## 2020-03-19 NOTE — ED Notes (Signed)
Pt ambulated on Room Air , pulse ox range 97-98%. No acute sob noted, pt tolerated well. C/o of feeling " tired".

## 2020-03-25 LAB — SUSCEPTIBILITY, AER + ANAEROB

## 2020-03-25 LAB — SUSCEPTIBILITY RESULT

## 2020-04-03 LAB — CULTURE, BLOOD (ROUTINE X 2)
Special Requests: ADEQUATE
Special Requests: ADEQUATE

## 2020-05-05 ENCOUNTER — Telehealth: Payer: Self-pay | Admitting: General Practice

## 2020-08-06 ENCOUNTER — Other Ambulatory Visit: Payer: Self-pay

## 2020-08-06 ENCOUNTER — Emergency Department (HOSPITAL_BASED_OUTPATIENT_CLINIC_OR_DEPARTMENT_OTHER): Payer: Self-pay

## 2020-08-06 ENCOUNTER — Emergency Department (HOSPITAL_BASED_OUTPATIENT_CLINIC_OR_DEPARTMENT_OTHER)
Admission: EM | Admit: 2020-08-06 | Discharge: 2020-08-06 | Disposition: A | Discharge status: Home / Self Care | Payer: Self-pay | Attending: Emergency Medicine | Admitting: Emergency Medicine

## 2020-08-06 ENCOUNTER — Encounter (HOSPITAL_BASED_OUTPATIENT_CLINIC_OR_DEPARTMENT_OTHER): Payer: Self-pay | Admitting: Emergency Medicine

## 2020-08-06 DIAGNOSIS — J209 Acute bronchitis, unspecified: Secondary | ICD-10-CM | POA: Insufficient documentation

## 2020-08-06 DIAGNOSIS — Z20822 Contact with and (suspected) exposure to covid-19: Secondary | ICD-10-CM | POA: Insufficient documentation

## 2020-08-06 DIAGNOSIS — F17210 Nicotine dependence, cigarettes, uncomplicated: Secondary | ICD-10-CM | POA: Insufficient documentation

## 2020-08-06 DIAGNOSIS — J45909 Unspecified asthma, uncomplicated: Secondary | ICD-10-CM | POA: Insufficient documentation

## 2020-08-06 LAB — GROUP A STREP BY PCR: Group A Strep by PCR: NOT DETECTED

## 2020-08-06 LAB — CBC WITH DIFFERENTIAL/PLATELET
Abs Immature Granulocytes: 0.03 10*3/uL (ref 0.00–0.07)
Basophils Absolute: 0 10*3/uL (ref 0.0–0.1)
Basophils Relative: 0 %
Eosinophils Absolute: 0 10*3/uL (ref 0.0–0.5)
Eosinophils Relative: 0 %
HCT: 43.2 % (ref 36.0–46.0)
Hemoglobin: 14.9 g/dL (ref 12.0–15.0)
Immature Granulocytes: 0 %
Lymphocytes Relative: 22 %
Lymphs Abs: 2.2 10*3/uL (ref 0.7–4.0)
MCH: 29.7 pg (ref 26.0–34.0)
MCHC: 34.5 g/dL (ref 30.0–36.0)
MCV: 86.1 fL (ref 80.0–100.0)
Monocytes Absolute: 0.8 10*3/uL (ref 0.1–1.0)
Monocytes Relative: 8 %
Neutro Abs: 6.9 10*3/uL (ref 1.7–7.7)
Neutrophils Relative %: 70 %
Platelets: 232 10*3/uL (ref 150–400)
RBC: 5.02 MIL/uL (ref 3.87–5.11)
RDW: 12.9 % (ref 11.5–15.5)
WBC: 10 10*3/uL (ref 4.0–10.5)
nRBC: 0 % (ref 0.0–0.2)

## 2020-08-06 LAB — BASIC METABOLIC PANEL
Anion gap: 14 (ref 5–15)
BUN: 11 mg/dL (ref 6–20)
CO2: 23 mmol/L (ref 22–32)
Calcium: 8.9 mg/dL (ref 8.9–10.3)
Chloride: 101 mmol/L (ref 98–111)
Creatinine, Ser: 1.01 mg/dL — ABNORMAL HIGH (ref 0.44–1.00)
GFR, Estimated: >60 mL/min (ref >60)
Glucose, Bld: 95 mg/dL (ref 70–99)
Potassium: 2.8 mmol/L — ABNORMAL LOW (ref 3.5–5.1)
Sodium: 138 mmol/L (ref 135–145)

## 2020-08-06 LAB — HCG, SERUM, QUALITATIVE: Preg, Serum: NEGATIVE

## 2020-08-06 MED ORDER — AZITHROMYCIN 250 MG PO TABS: 250.0000 mg | ORAL_TABLET | Freq: Every day | ORAL | 0 refills | Status: AC

## 2020-08-06 MED ORDER — ACETAMINOPHEN 325 MG PO TABS
650.0000 mg | ORAL_TABLET | Freq: Once | ORAL | Status: AC
  Administered 2020-08-06: 650 mg via ORAL
  Filled 2020-08-06: qty 2

## 2020-08-06 MED ORDER — METHYLPREDNISOLONE SODIUM SUCC 125 MG IJ SOLR
125.0000 mg | Freq: Once | INTRAMUSCULAR | Status: AC
  Administered 2020-08-06: 125 mg via INTRAVENOUS
  Filled 2020-08-06: qty 2

## 2020-08-06 MED ORDER — KETOROLAC TROMETHAMINE 15 MG/ML IJ SOLN
15.0000 mg | Freq: Once | INTRAMUSCULAR | Status: AC
  Administered 2020-08-06: 15 mg via INTRAVENOUS
  Filled 2020-08-06: qty 1

## 2020-08-06 MED ORDER — ALBUTEROL SULFATE HFA 108 (90 BASE) MCG/ACT IN AERS: 1.0000 | INHALATION_SPRAY | Freq: Four times a day (QID) | RESPIRATORY_TRACT | 0 refills | Status: AC | PRN

## 2020-08-06 MED ORDER — SODIUM CHLORIDE 0.9 % IV BOLUS
1000.0000 mL | Freq: Once | INTRAVENOUS | Status: AC
  Administered 2020-08-06: 1000 mL via INTRAVENOUS

## 2020-08-06 MED ORDER — ALBUTEROL SULFATE HFA 108 (90 BASE) MCG/ACT IN AERS
INHALATION_SPRAY | RESPIRATORY_TRACT | Status: AC
  Administered 2020-08-06: 8
  Filled 2020-08-06: qty 6.7

## 2020-08-06 MED ORDER — FENTANYL CITRATE (PF) 100 MCG/2ML IJ SOLN
50.0000 ug | Freq: Once | INTRAMUSCULAR | Status: AC
  Administered 2020-08-06: 50 ug via INTRAVENOUS
  Filled 2020-08-06: qty 2

## 2020-08-06 MED ORDER — PREDNISONE 20 MG PO TABS: 60.0000 mg | ORAL_TABLET | Freq: Every day | ORAL | 0 refills | Status: AC

## 2020-08-06 NOTE — ED Notes (Signed)
ED Provider at bedside. 

## 2020-08-06 NOTE — ED Notes (Signed)
Discharge instructions discussed with patient. Medications reviewed. Departs ED at this time in stable condition.

## 2020-08-06 NOTE — ED Triage Notes (Signed)
Cough since yesterday. Hx of asthma. Using inhalers without relief.

## 2020-08-06 NOTE — ED Provider Notes (Signed)
[facility] EMERGENCY DEPARTMENT Provider Note   CSN: [id number] Arrival date & time: 08/06/20  1220     History Chief Complaint  Patient presents with  . Cough    Mary Serrano is a 20 y.o. female.  She is complaining of cough, shortness of breath, headache, hoarse voice for the last few days.  She has been using an over-the-counter inhaler without relief.  Smoker.  Not Covid vaccinated.  Poor p.o. intake.  The history is provided by the patient.  Cough Cough characteristics:  Non-productive Severity:  Severe Onset quality:  Gradual Timing:  Intermittent Progression:  Unchanged Chronicity:  New Smoker: yes   Relieved by:  Nothing Worsened by:  Activity Associated symptoms: fever, headaches, shortness of breath and sore throat   Associated symptoms: no chest pain, no myalgias and no rash        Past Medical History:  Diagnosis Date  . Asthma     There are no problems to display for this patient.   History reviewed. No pertinent surgical history.   OB History   No obstetric history on file.     No family history on file.  Social History   Tobacco Use  . Smoking status: Current Every Day Smoker    Types: Cigarettes  . Smokeless tobacco: Never Used  Vaping Use  . Vaping Use: Never used  Substance Use Topics  . Alcohol use: Yes    Comment: occ  . Drug use: No    Home Medications Prior to Admission medications   Medication Sig Start Date End Date Taking? Authorizing Provider  benzonatate (TESSALON) 100 MG capsule Take 1 capsule (100 mg total) by mouth every 8 (eight) hours. 10/20/19   Hyman Hopes, Margaux, PA-C  ondansetron (ZOFRAN ODT) 4 MG disintegrating tablet Take 1 tablet (4 mg total) by mouth every 8 (eight) hours as needed for nausea or vomiting. 03/19/20   Alvira Monday, MD  albuterol (PROVENTIL HFA;VENTOLIN HFA) 108 (90 BASE) MCG/ACT inhaler Inhale 2 puffs into the lungs every 4 (four) hours as needed for wheezing or shortness of breath.  07/26/13 04/26/19  Ward, Layla Maw, DO    Allergies    Penicillins  Review of Systems   Review of Systems  Constitutional: Positive for fever.  HENT: Positive for sore throat.   Eyes: Negative for visual disturbance.  Respiratory: Positive for cough and shortness of breath.   Cardiovascular: Negative for chest pain.  Gastrointestinal: Negative for abdominal pain.  Genitourinary: Negative for dysuria.  Musculoskeletal: Negative for myalgias.  Skin: Negative for rash.  Neurological: Positive for headaches.    Physical Exam Updated Vital Signs BP (!) 143/113 (BP Location: Left Arm)   Pulse 83   Temp (!) 100.7 F (38.2 C) (Oral)   Resp (!) 24   SpO2 94%   Physical Exam Vitals and nursing note reviewed.  Constitutional:      General: She is in acute distress.     Appearance: Normal appearance. She is well-developed and well-nourished.  HENT:     Head: Normocephalic and atraumatic.     Mouth/Throat:     Mouth: Mucous membranes are moist.     Pharynx: Oropharynx is clear.  Eyes:     Conjunctiva/sclera: Conjunctivae normal.  Cardiovascular:     Rate and Rhythm: Normal rate and regular rhythm.     Heart sounds: No murmur heard.   Pulmonary:     Effort: Tachypnea and respiratory distress present.     Breath sounds: Normal breath sounds.  Decreased air movement present.  Abdominal:     Palpations: Abdomen is soft.     Tenderness: There is no abdominal tenderness.  Musculoskeletal:        General: No edema. Normal range of motion.     Cervical back: Neck supple.     Right lower leg: No edema.     Left lower leg: No edema.  Skin:    General: Skin is warm and dry.  Neurological:     General: No focal deficit present.     Mental Status: She is alert.  Psychiatric:        Mood and Affect: Mood and affect normal.     ED Results / Procedures / Treatments   Labs (all labs ordered are listed, but only abnormal results are displayed) Labs Reviewed  BASIC METABOLIC  PANEL - Abnormal; Notable for the following components:      Result Value   Potassium 2.8 (*)    Creatinine, Ser 1.01 (*)    All other components within normal limits  SARS CORONAVIRUS 2 (TAT 6-24 HRS)  GROUP A STREP BY PCR  CBC WITH DIFFERENTIAL/PLATELET  HCG, SERUM, QUALITATIVE    EKG None  Radiology DG Chest Port 1 View  Result Date: 08/06/2020 CLINICAL DATA:  Shortness of breath, cough EXAM: PORTABLE CHEST 1 VIEW COMPARISON:  03/18/2020 FINDINGS: Heart and mediastinal contours are within normal limits. No focal opacities or effusions. No acute bony abnormality. IMPRESSION: No active cardiopulmonary disease. Electronically Signed   By: Charlett Nose M.D.   On: 08/06/2020 14:08    Procedures Procedures (including critical care time)  Medications Ordered in ED Medications  sodium chloride 0.9 % bolus 1,000 mL (has no administration in time range)  fentaNYL (SUBLIMAZE) injection 50 mcg (has no administration in time range)  ketorolac (TORADOL) 15 MG/ML injection 15 mg (has no administration in time range)  albuterol (VENTOLIN HFA) 108 (90 Base) MCG/ACT inhaler (8 puffs  Given 08/06/20 1309)  acetaminophen (TYLENOL) tablet 650 mg (650 mg Oral Given 08/06/20 1425)  methylPREDNISolone sodium succinate (SOLU-MEDROL) 125 mg/2 mL injection 125 mg (125 mg Intravenous Given 08/06/20 1439)    ED Course  I have reviewed the triage vital signs and the nursing notes.  Pertinent labs & imaging results that were available during my care of the patient were reviewed by me and considered in my medical decision making (see chart for details).  Clinical Course as of 08/06/20 1715  Sat Aug 06, 2020  1411 Chest x-ray does not show any acute infiltrates. [MB]    Clinical Course User Index [MB] Terrilee Files, MD   MDM Rules/Calculators/A&P                         Mary Serrano was evaluated in Emergency Department on 08/06/2020 for the symptoms described in the history of present illness. She was  evaluated in the context of the global COVID-19 pandemic, which necessitated consideration that the patient might be at risk for infection with the SARS-CoV-2 virus that causes COVID-19. Institutional protocols and algorithms that pertain to the evaluation of patients at risk for COVID-19 are in a state of rapid change based on information released by regulatory bodies including the CDC and federal and state organizations. These policies and algorithms were followed during the patient's care in the ED.  This patient complains of cough hoarse voice shortness of breath; this involves an extensive number of treatment Options and is a complaint  that carries with it a high risk of complications and Morbidity. The differential includes Covid, pneumonia, bronchitis, laryngitis, pharyngitis  I ordered, reviewed and interpreted labs, which included CBC with normal white count normal hemoglobin, chemistries with low potassium normal creatinine, pregnancy test negative, Covid testing pending I ordered medication Tylenol for fever, steroids and albuterol inhaler shortness of breath I ordered imaging studies which included chest x-ray and I independently    visualized and interpreted imaging which showed no acute pulmonary disease Additional history obtained from patient's family member Previous records obtained and reviewed in epic, no recent admissions  After the interventions stated above, I reevaluated the patient and found patient to be improved but still symptomatic. Her care is signed out to oncoming provider Dr. Alvino Chapel to follow-up on rest of labs and reassessment of symptoms.   Final Clinical Impression(s) / ED Diagnoses Final diagnoses:  Acute bronchitis, unspecified organism    Rx / DC Orders ED Discharge Orders         Ordered    azithromycin (ZITHROMAX) 250 MG tablet  Daily        08/06/20 1413    predniSONE (DELTASONE) 20 MG tablet  Daily        08/06/20 1413           Hayden Rasmussen, MD 08/06/20 1717

## 2020-08-06 NOTE — Discharge Instructions (Addendum)
You were seen in the emergency department for cough and shortness of breath.  Your chest x-ray did not show any obvious pneumonia.  You received some steroids and breathing treatments and IV fluids with improvement in your symptoms.  You also had a low-grade temperature.  Your Covid testing was pending at time of discharge.  Please finish your antibiotics and steroids.  Drink plenty of fluids.  Albuterol 2 puffs every 4-6 hours as needed.  Follow-up with your doctor.  Return to the emergency department for any worsening or concerning symptoms

## 2020-08-06 NOTE — ED Provider Notes (Addendum)
  Physical Exam  BP 116/64 (BP Location: Left Arm)   Pulse (!) 114   Temp 99.4 F (37.4 C) (Oral)   Resp (!) 22   SpO2 94%   Physical Exam  ED Course/Procedures   Clinical Course as of 08/06/20 1709  Sat Aug 06, 2020  1411 Chest x-ray does not show any acute infiltrates. [MB]    Clinical Course User Index [MB] Terrilee Files, MD    Procedures  MDM  Received in signout.  Cough fever some shortness of breath.  Continued tachycardia and still feels febrile.  Continues to cough.  Posterior pharynx has some erythema without exudate.  Does have some small vesicles.  We will add strep test since patient states there is a severe amount of pain there.  No peritonsillar abscess seen.  Patient feeling somewhat better now.  Cough improved some.  Will discharge home.  Covid test still pending.  Negative strep test. Hypokalemia may be secondary to the breathing treatment she has had. Can be followed as an outpatient      Benjiman Core, MD 08/06/20 Ovidio Kin    Benjiman Core, MD 08/06/20 [phone]

## 2020-08-06 NOTE — Progress Notes (Signed)
Pt seen for increased wob, hx of asthma. Per significant other has been taking an over the counter inhaler: Primatene Mist ; Epinephrine inhalation aerosol 0.125mg /day. Per pt and siginificant other pt has been using this inhaler 1p Q30 minutes with little to no relief x1 day.  Pt and significant other instructed on Albuterol MDI with spacer. 8p given to pt with relief. Pt instructed on purse lip breathing in aide to slow RR.  RT will continue to monitor and be available as needed.

## 2020-08-07 LAB — SARS CORONAVIRUS 2 (TAT 6-24 HRS): SARS Coronavirus 2: NEGATIVE

## 2021-05-22 ENCOUNTER — Encounter (HOSPITAL_BASED_OUTPATIENT_CLINIC_OR_DEPARTMENT_OTHER): Payer: Self-pay | Admitting: *Deleted

## 2021-05-22 ENCOUNTER — Emergency Department (HOSPITAL_BASED_OUTPATIENT_CLINIC_OR_DEPARTMENT_OTHER): Payer: Self-pay

## 2021-05-22 ENCOUNTER — Other Ambulatory Visit: Payer: Self-pay

## 2021-05-22 DIAGNOSIS — R1031 Right lower quadrant pain: Secondary | ICD-10-CM | POA: Insufficient documentation

## 2021-05-22 DIAGNOSIS — R0602 Shortness of breath: Secondary | ICD-10-CM | POA: Insufficient documentation

## 2021-05-22 DIAGNOSIS — F17210 Nicotine dependence, cigarettes, uncomplicated: Secondary | ICD-10-CM | POA: Insufficient documentation

## 2021-05-22 DIAGNOSIS — J45909 Unspecified asthma, uncomplicated: Secondary | ICD-10-CM | POA: Insufficient documentation

## 2021-05-22 DIAGNOSIS — J1089 Influenza due to other identified influenza virus with other manifestations: Secondary | ICD-10-CM | POA: Insufficient documentation

## 2021-05-22 DIAGNOSIS — Z7951 Long term (current) use of inhaled steroids: Secondary | ICD-10-CM | POA: Insufficient documentation

## 2021-05-22 LAB — URINALYSIS, ROUTINE W REFLEX MICROSCOPIC
Bilirubin Urine: NEGATIVE
Glucose, UA: NEGATIVE mg/dL
Hgb urine dipstick: NEGATIVE
Ketones, ur: NEGATIVE mg/dL
Leukocytes,Ua: NEGATIVE
Nitrite: NEGATIVE
Protein, ur: NEGATIVE mg/dL
Specific Gravity, Urine: >1.03 — ABNORMAL HIGH (ref 1.005–1.030)
pH: 5.5 (ref 5.0–8.0)

## 2021-05-22 LAB — COMPREHENSIVE METABOLIC PANEL
ALT: 26 U/L (ref 0–44)
AST: 33 U/L (ref 15–41)
Albumin: 4.5 g/dL (ref 3.5–5.0)
Alkaline Phosphatase: 67 U/L (ref 38–126)
Anion gap: 9 (ref 5–15)
BUN: 14 mg/dL (ref 6–20)
CO2: 22 mmol/L (ref 22–32)
Calcium: 9.5 mg/dL (ref 8.9–10.3)
Chloride: 104 mmol/L (ref 98–111)
Creatinine, Ser: 0.85 mg/dL (ref 0.44–1.00)
GFR, Estimated: >60 mL/min (ref >60)
Glucose, Bld: 114 mg/dL — ABNORMAL HIGH (ref 70–99)
Potassium: 3.9 mmol/L (ref 3.5–5.1)
Sodium: 135 mmol/L (ref 135–145)
Total Bilirubin: 0.3 mg/dL (ref 0.3–1.2)
Total Protein: 7.8 g/dL (ref 6.5–8.1)

## 2021-05-22 LAB — CBC
HCT: 46.2 % — ABNORMAL HIGH (ref 36.0–46.0)
Hemoglobin: 15.5 g/dL — ABNORMAL HIGH (ref 12.0–15.0)
MCH: 29.2 pg (ref 26.0–34.0)
MCHC: 33.5 g/dL (ref 30.0–36.0)
MCV: 87 fL (ref 80.0–100.0)
Platelets: 228 10*3/uL (ref 150–400)
RBC: 5.31 MIL/uL — ABNORMAL HIGH (ref 3.87–5.11)
RDW: 13.3 % (ref 11.5–15.5)
WBC: 7.7 10*3/uL (ref 4.0–10.5)
nRBC: 0 % (ref 0.0–0.2)

## 2021-05-22 LAB — LIPASE, BLOOD: Lipase: 43 U/L (ref 11–51)

## 2021-05-22 LAB — PREGNANCY, URINE: Preg Test, Ur: NEGATIVE

## 2021-05-22 NOTE — ED Triage Notes (Signed)
Pt reports cough, headache, fever, shortness of breath, fatigue. Pt says she was exposed to flu, went to the doctor today and she was given Tamiflu, inhaler, prednisone for flu+. Pt says that she vomited after taking tamiflu, says she has vomited "10-15 times". States Blood tinged mucous.   -COVID

## 2021-05-23 ENCOUNTER — Emergency Department (HOSPITAL_BASED_OUTPATIENT_CLINIC_OR_DEPARTMENT_OTHER)
Admission: EM | Admit: 2021-05-23 | Discharge: 2021-05-23 | Disposition: A | Discharge status: Home / Self Care | Payer: Self-pay | Attending: Emergency Medicine | Admitting: Emergency Medicine

## 2021-05-23 ENCOUNTER — Emergency Department (HOSPITAL_BASED_OUTPATIENT_CLINIC_OR_DEPARTMENT_OTHER): Payer: Self-pay

## 2021-05-23 DIAGNOSIS — R112 Nausea with vomiting, unspecified: Secondary | ICD-10-CM

## 2021-05-23 DIAGNOSIS — R1031 Right lower quadrant pain: Secondary | ICD-10-CM

## 2021-05-23 DIAGNOSIS — J111 Influenza due to unidentified influenza virus with other respiratory manifestations: Secondary | ICD-10-CM

## 2021-05-23 LAB — GROUP A STREP BY PCR: Group A Strep by PCR: NOT DETECTED

## 2021-05-23 MED ORDER — ONDANSETRON HCL 4 MG/2ML IJ SOLN: 4.0000 mg | Freq: Once | INTRAMUSCULAR | Status: DC

## 2021-05-23 MED ORDER — ONDANSETRON 4 MG PO TBDP
4.0000 mg | ORAL_TABLET | Freq: Once | ORAL | Status: AC
  Administered 2021-05-23: 4 mg via ORAL
  Filled 2021-05-23: qty 1

## 2021-05-23 MED ORDER — OXYCODONE-ACETAMINOPHEN 5-325 MG PO TABS
1.0000 | ORAL_TABLET | ORAL | Status: AC | PRN
  Administered 2021-05-23 (×2): 1 via ORAL
  Filled 2021-05-23 (×2): qty 1

## 2021-05-23 MED ORDER — BENZONATATE 100 MG PO CAPS: 100.0000 mg | ORAL_CAPSULE | Freq: Three times a day (TID) | ORAL | 0 refills | Status: AC

## 2021-05-23 MED ORDER — ONDANSETRON HCL 4 MG PO TABS: 4.0000 mg | ORAL_TABLET | Freq: Three times a day (TID) | ORAL | 0 refills | Status: DC | PRN

## 2021-05-23 MED ORDER — PROMETHAZINE HCL 25 MG PO TABS: 25.0000 mg | ORAL_TABLET | Freq: Three times a day (TID) | ORAL | 0 refills | Status: AC | PRN

## 2021-05-23 MED ORDER — IOHEXOL 350 MG/ML SOLN
100.0000 mL | Freq: Once | INTRAVENOUS | Status: AC | PRN
  Administered 2021-05-23: 100 mL via INTRAVENOUS

## 2021-05-23 MED ORDER — BENZONATATE 100 MG PO CAPS: 100.0000 mg | ORAL_CAPSULE | Freq: Once | ORAL | Status: DC

## 2021-05-23 MED ORDER — SODIUM CHLORIDE 0.9 % IV BOLUS
1000.0000 mL | Freq: Once | INTRAVENOUS | Status: AC
  Administered 2021-05-23: 1000 mL via INTRAVENOUS

## 2021-05-23 MED ORDER — ONDANSETRON HCL 4 MG/2ML IJ SOLN
4.0000 mg | Freq: Once | INTRAMUSCULAR | Status: AC
  Administered 2021-05-23: 4 mg via INTRAVENOUS
  Filled 2021-05-23: qty 2

## 2021-05-23 NOTE — ED Notes (Signed)
Pt discharged to home. Discharge instructions have been discussed with patient and/or family members. Pt verbally acknowledges understanding d/c instructions, and endorses comprehension to checkout at registration before leaving.  °

## 2021-05-23 NOTE — Discharge Instructions (Addendum)
You have been seen and discharged from the emergency department.  Your symptoms are all related to your influenza infection.  Continue taking the medication that has been prescribed to you.  Cough medicine has been prescribed as well as a nausea medicine called Phenergan.  Use these as needed.  Follow-up with your primary provider for reevaluation and further care. Take home medications as prescribed. If you have any worsening symptoms or further concerns for your health please return to an emergency department for further evaluation.

## 2021-05-23 NOTE — ED Notes (Signed)
Pt is tearful, reports throat pain is increased by cough. Pt requesting pain medication and cough medication. EDP [place] updated.

## 2021-05-23 NOTE — ED Provider Notes (Signed)
Patient signed out to me by previous provider. Please refer to their note for full HPI.  Briefly this is a 20 year old female who is influenza positive who presented with sore throats and abdominal pain.  Patient is pending CT of the abdomen pelvis due to tenderness in the right lower quadrant. Physical Exam  BP 107/71   Pulse 76   Temp 99.9 F (37.7 C) (Oral)   Resp 18   Ht 5\' 5"  (1.651 m)   Wt 87.1 kg   LMP 05/01/2021   SpO2 96%   BMI 31.95 kg/m   Physical Exam Vitals and nursing note reviewed.  Constitutional:      Appearance: Normal appearance.  HENT:     Head: Normocephalic.     Mouth/Throat:     Mouth: Mucous membranes are moist.  Cardiovascular:     Rate and Rhythm: Normal rate.  Pulmonary:     Effort: Pulmonary effort is normal. No respiratory distress.  Abdominal:     Palpations: Abdomen is soft.     Tenderness: There is abdominal tenderness. There is guarding.  Skin:    General: Skin is warm.  Neurological:     Mental Status: She is alert and oriented to person, place, and time. Mental status is at baseline.  Psychiatric:        Mood and Affect: Mood normal.    ED Course/Procedures     Procedures  MDM   CAT scan of the chest abdomen pelvis is negative for acute pathology.  After symptomatic treatment the patient feels improved.  She will be discharged on medication for symptom control, is already taking Tamiflu.  Patient at this time appears safe and stable for discharge and will be treated as an outpatient.  Discharge plan and strict return to ED precautions discussed, patient verbalizes understanding and agreement.       05/03/2021, DO 05/23/21 05/25/21

## 2021-05-23 NOTE — ED Provider Notes (Signed)
[facility] EMERGENCY DEPARTMENT Provider Note   CSN: [id number] Arrival date & time: 05/22/21  2237     History Chief Complaint  Patient presents with   Emesis    Mary Serrano is a 20 y.o. female.  Patient diagnosed with flu earlier today by her PCP.  She has had approximately 6 days of cough, headache, fever, shortness of breath, runny nose and sore throat.  She has had intermittent posttussive emesis as well.  She was prescribed Tamiflu by her PCP today and since then has had increased nausea and vomiting mostly posttussive unable to keep anything down.  States her emesis is a mixture of "vomit and phlegm".  Feels like she is not able to eat or drink anything.  Has not had diarrhea.  Has not had any pain with urination or blood in the urine.  Continues to have headache, congestion, runny nose, sore throat, and cough.  Does feel somewhat short of breath.  No travel but has had multiple sick contacts at home.  On arrival here she has significant right-sided lower abdominal tenderness which she was not aware of.  Denies any pain with urination or blood in the urine  The history is provided by the patient.  Emesis Associated symptoms: abdominal pain, arthralgias, cough, fever, headaches, myalgias and sore throat       Past Medical History:  Diagnosis Date   Asthma     There are no problems to display for this patient.   History reviewed. No pertinent surgical history.   OB History   No obstetric history on file.     No family history on file.  Social History   Tobacco Use   Smoking status: Every Day    Types: Cigarettes   Smokeless tobacco: Never  Vaping Use   Vaping Use: Never used  Substance Use Topics   Alcohol use: Yes    Comment: occ   Drug use: No    Home Medications Prior to Admission medications   Medication Sig Start Date End Date Taking? Authorizing Provider  albuterol (VENTOLIN HFA) 108 (90 Base) MCG/ACT inhaler Inhale 1-2 puffs into  the lungs every 6 (six) hours as needed for wheezing or shortness of breath. 08/06/20   Benjiman Core, MD  azithromycin (ZITHROMAX) 250 MG tablet Take 1 tablet (250 mg total) by mouth daily. Take first 2 tablets together, then 1 every day until finished. 08/06/20   Terrilee Files, MD  benzonatate (TESSALON) 100 MG capsule Take 1 capsule (100 mg total) by mouth every 8 (eight) hours. 10/20/19   Hyman Hopes, Margaux, PA-C  ondansetron (ZOFRAN ODT) 4 MG disintegrating tablet Take 1 tablet (4 mg total) by mouth every 8 (eight) hours as needed for nausea or vomiting. 03/19/20   Alvira Monday, MD  predniSONE (DELTASONE) 20 MG tablet Take 3 tablets (60 mg total) by mouth daily. 08/06/20   Terrilee Files, MD    Allergies    Penicillins  Review of Systems   Review of Systems  Constitutional:  Positive for activity change, appetite change, fatigue and fever.  HENT:  Positive for congestion, rhinorrhea and sore throat.   Respiratory:  Positive for cough.   Cardiovascular:  Negative for chest pain.  Gastrointestinal:  Positive for abdominal pain, nausea and vomiting.  Genitourinary:  Negative for dysuria and hematuria.  Musculoskeletal:  Positive for arthralgias and myalgias.  Skin:  Negative for wound.  Neurological:  Positive for headaches. Negative for weakness.   all other systems are negative  except as noted in the HPI and PMH.   Physical Exam Updated Vital Signs BP 129/83 (BP Location: Left Arm)   Pulse (!) 104   Temp 99.9 F (37.7 C) (Oral)   Resp (!) 22   Ht 5\' 5"  (1.651 m)   Wt 87.1 kg   LMP 05/01/2021   SpO2 95%   BMI 31.95 kg/m   Physical Exam Vitals and nursing note reviewed.  Constitutional:      General: She is not in acute distress.    Appearance: She is well-developed.  HENT:     Head: Normocephalic and atraumatic.     Mouth/Throat:     Pharynx: No oropharyngeal exudate.  Eyes:     Conjunctiva/sclera: Conjunctivae normal.     Pupils: Pupils are equal, round, and  reactive to light.  Neck:     Comments: No meningismus. Cardiovascular:     Rate and Rhythm: Normal rate and regular rhythm.     Heart sounds: Normal heart sounds. No murmur heard. Pulmonary:     Effort: Pulmonary effort is normal. No respiratory distress.     Breath sounds: Normal breath sounds. No wheezing.  Abdominal:     Palpations: Abdomen is soft.     Tenderness: There is abdominal tenderness. There is guarding. There is no rebound.     Comments: TTP RLQ with voluntary guarding and rebound  Musculoskeletal:        General: No tenderness. Normal range of motion.     Cervical back: Normal range of motion and neck supple.  Skin:    General: Skin is warm.  Neurological:     Mental Status: She is alert and oriented to person, place, and time.     Cranial Nerves: No cranial nerve deficit.     Motor: No abnormal muscle tone.     Coordination: Coordination normal.     Comments:  5/5 strength throughout. CN 2-12 intact.Equal grip strength.   Psychiatric:        Behavior: Behavior normal.    ED Results / Procedures / Treatments   Labs (all labs ordered are listed, but only abnormal results are displayed) Labs Reviewed  COMPREHENSIVE METABOLIC PANEL - Abnormal; Notable for the following components:      Result Value   Glucose, Bld 114 (*)    All other components within normal limits  CBC - Abnormal; Notable for the following components:   RBC 5.31 (*)    Hemoglobin 15.5 (*)    HCT 46.2 (*)    All other components within normal limits  URINALYSIS, ROUTINE W REFLEX MICROSCOPIC - Abnormal; Notable for the following components:   Specific Gravity, Urine >1.030 (*)    All other components within normal limits  LIPASE, BLOOD  PREGNANCY, URINE    EKG None  Radiology DG Chest 2 View  Result Date: 05/22/2021 CLINICAL DATA:  Cough, fever EXAM: CHEST - 2 VIEW COMPARISON:  08/06/2020 FINDINGS: Lungs are clear.  No pleural effusion or pneumothorax. The heart is normal in size.  Visualized osseous structures are within normal limits. IMPRESSION: Normal chest radiographs. Electronically Signed   By: 10/04/2020 M.D.   On: 05/22/2021 23:11    Procedures Procedures   Medications Ordered in ED Medications  oxyCODONE-acetaminophen (PERCOCET/ROXICET) 5-325 MG per tablet 1 tablet (1 tablet Oral Given 05/23/21 0226)  sodium chloride 0.9 % bolus 1,000 mL (has no administration in time range)  ondansetron (ZOFRAN) injection 4 mg (has no administration in time range)  ondansetron (ZOFRAN-ODT) disintegrating  tablet 4 mg (4 mg Oral Given 05/23/21 0224)    ED Course  I have reviewed the triage vital signs and the nursing notes.  Pertinent labs & imaging results that were available during my care of the patient were reviewed by me and considered in my medical decision making (see chart for details).    MDM Rules/Calculators/A&P                          Flu positive here with nausea and vomiting mostly posttussive.  Has significant right-sided lower abdominal pain on exam however.  Labs are reassuring.  Will hydrate and treat symptoms.  Urinalysis negative.  hCG negative.  It appears patient is having mostly posttussive emesis rather than true nausea and vomiting.  However on exam she has significant right-sided lower abdominal pain.  Given her fever and chills will obtain imaging to evaluate for appendicitis.  Care to be transferred at shift change Dr. Wilkie Aye to assume care.  Final Clinical Impression(s) / ED Diagnoses Final diagnoses:  Influenza  Nausea and vomiting, unspecified vomiting type  RLQ abdominal pain    Rx / DC Orders ED Discharge Orders     None        Maysen Sudol, Jeannett Senior, MD 05/23/21 [phone]

## 2023-09-16 IMAGING — CT CT ANGIO CHEST
2 of 8 series · 18 of 36 positions shown · IV contrast (Omnipaque)
Comparison: Chest radiographs yesterday.

CTA chest 03/18/2020.  No prior CT Abdomen and Pelvis

CLINICAL DATA: 20-year-old female with cough, headache, fever,
shortness of breath, vomiting. Right lower quadrant abdominal pain.

EXAM:
CT ANGIOGRAPHY CHEST
CT ABDOMEN AND PELVIS WITH CONTRAST
TECHNIQUE: Multidetector CT imaging of the chest was performed using the
standard protocol during bolus administration of intravenous
contrast. Multiplanar CT image reconstructions and MIPs were
obtained to evaluate the vascular anatomy. Multidetector CT imaging
of the abdomen and pelvis was performed using the standard protocol
during bolus administration of intravenous contrast.
CONTRAST:  100mL OMNIPAQUE IOHEXOL 350 MG/ML SOLN

[Series 6: pe thins · axial · 0.95mm/px · z∈[+1086,+1325]mm · 17 of 269 slices shown]
[im 15/269  lung]
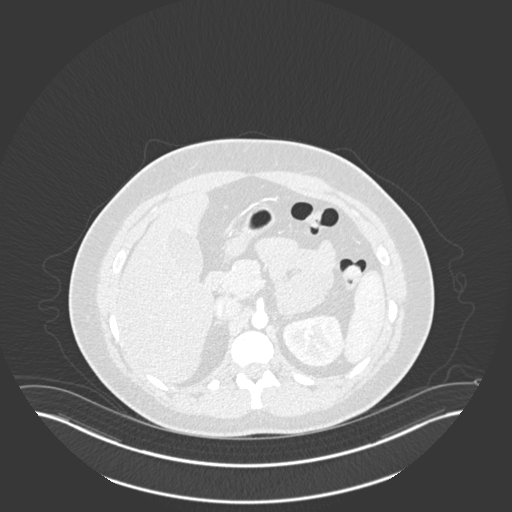
[im 29/269  mediastinal]
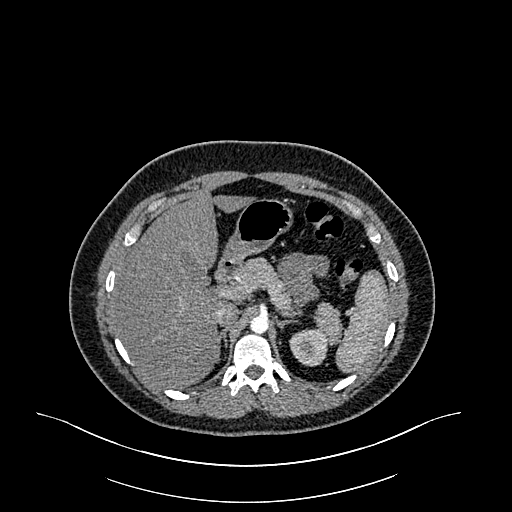
[im 43/269  lung]
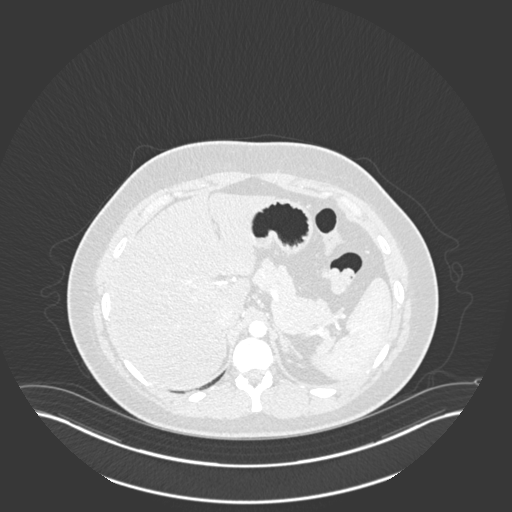
[im 57/269  mediastinal]
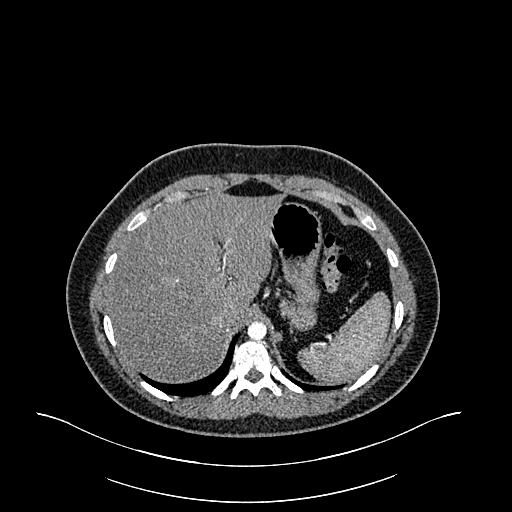
[im 71/269  lung]
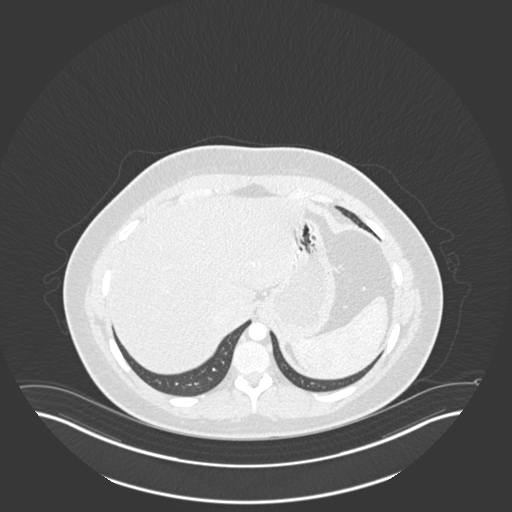
[im 85/269  mediastinal]
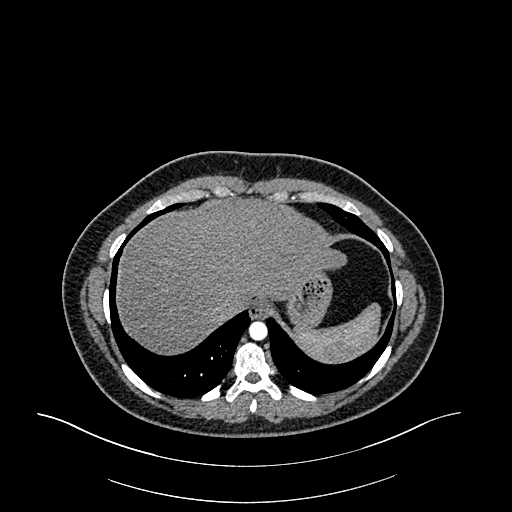
[im 99/269  lung]
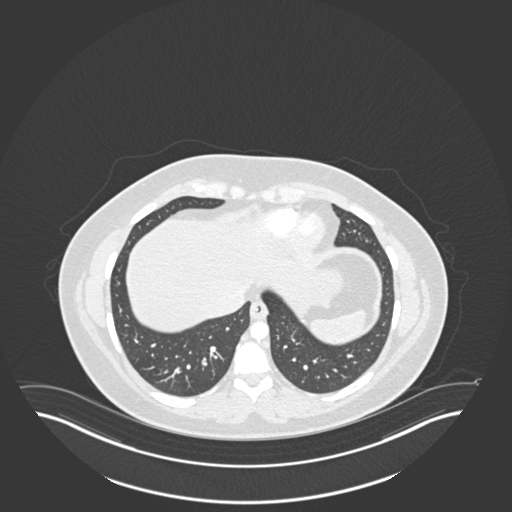
[im 113/269  mediastinal]
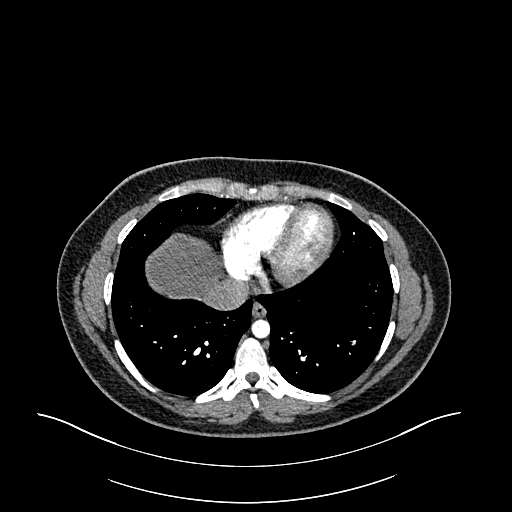
[im 142/269  lung]
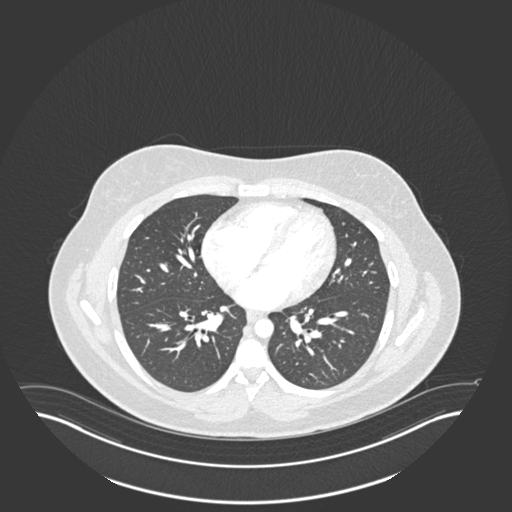
[im 156/269  mediastinal]
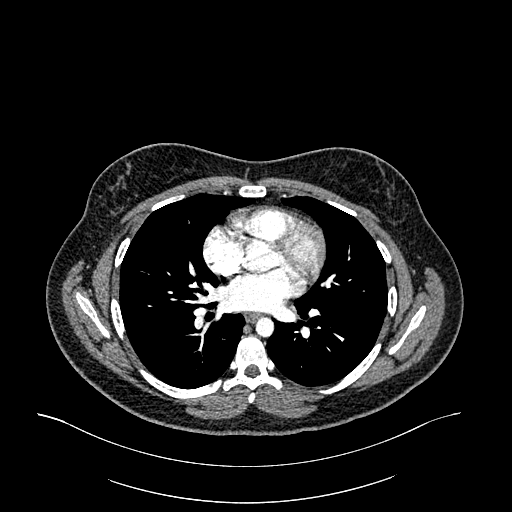
[im 170/269  lung]
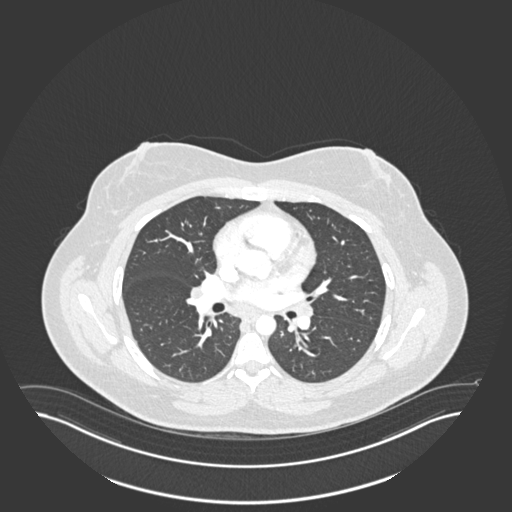
[im 184/269  mediastinal]
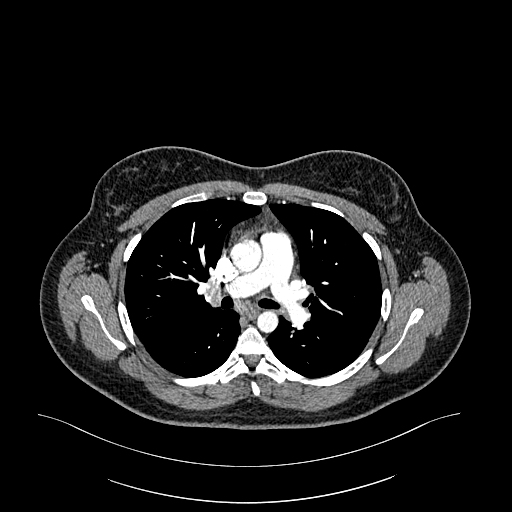
[im 198/269  lung]
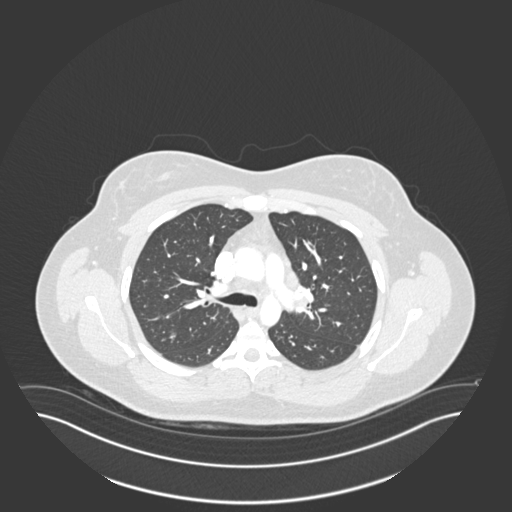
[im 212/269  mediastinal]
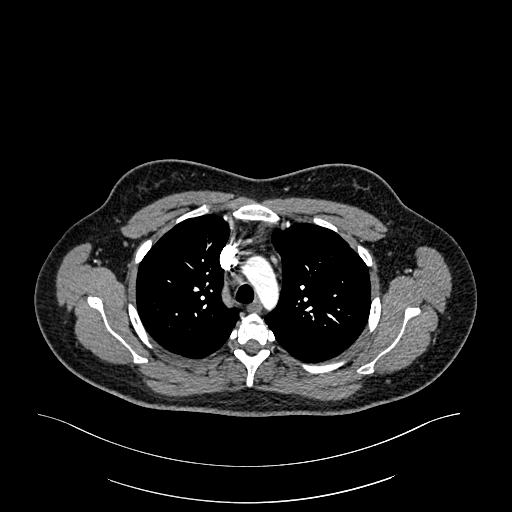
[im 226/269  lung]
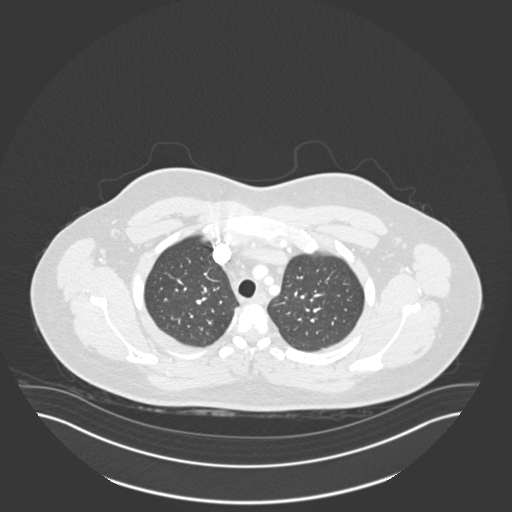
[im 240/269  mediastinal]
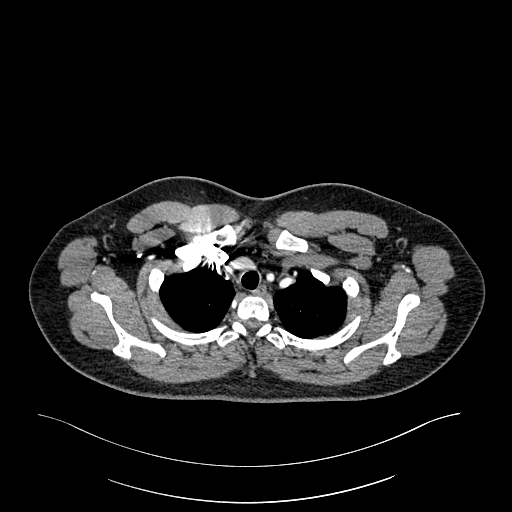
[im 254/269  lung]
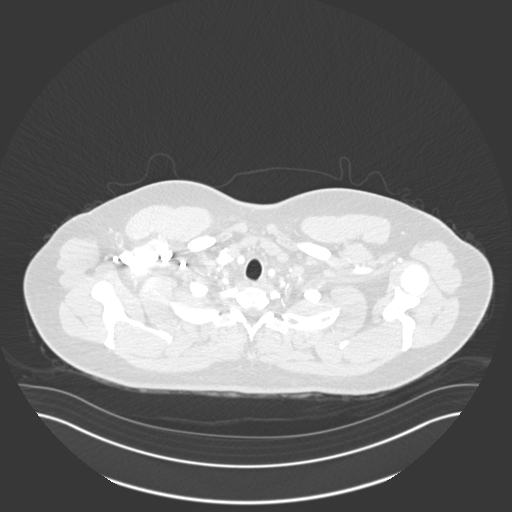

[Series 7: pe coronal mpr · coronal · 0.53mm/px · 1 of 130 slices shown]
[im 65/130  mediastinal]
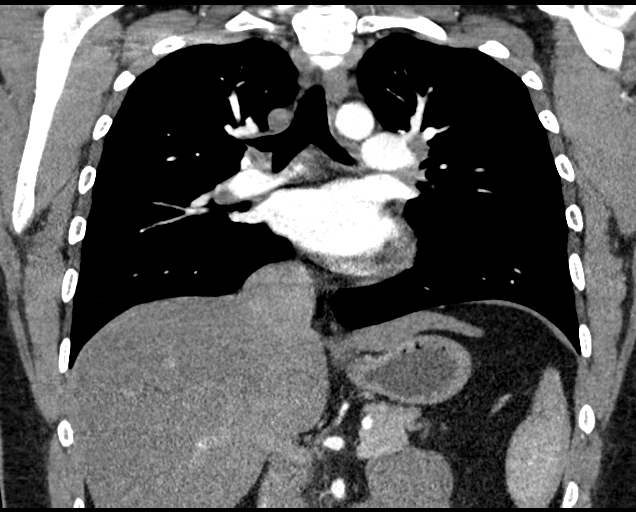

[18 of 36 positions shown; findings below may reference images not displayed]

FINDINGS: CTA CHEST FINDINGS

Cardiovascular: Adequate contrast bolus timing in the pulmonary
arterial tree.

No focal filling defect identified in the pulmonary arteries to
suggest acute pulmonary embolism.

No cardiomegaly or pericardial effusion.  Normal thoracic aorta.

Mediastinum/Nodes: Mediastinal and hilar lymph nodes appear stable
since last year, some at the upper limits of normal (pre-vascular
node series 4, image 23). This is nonspecific. No axillary
lymphadenopathy.

Lungs/Pleura: Major airways are patent. Lung volumes are within
normal limits. Lung parenchyma appears stable and clear. No pleural
effusion.

Musculoskeletal: Negative.

Review of the MIP images confirms the above findings.

CT ABDOMEN and PELVIS FINDINGS

Hepatobiliary: Possible hepatic steatosis but otherwise negative
liver and gallbladder.

Pancreas: Negative.

Spleen: Negative.

Adrenals/Urinary Tract: Normal adrenal glands. Symmetric renal
enhancement. Mildly transverse positioning of the right kidney,
normal variant. No hydronephrosis or pararenal inflammation. No
nephrolithiasis is evident. Ureters appear symmetric and within
normal limits. There are some pelvic phleboliths, more so on the
left. The bladder appears negative.

Stomach/Bowel: Negative large bowel, with normal retrocecal appendix
on series 2, image 59. Terminal ileum appears negative. No dilated
small bowel. Stomach is within normal limits. Duodenum is
decompressed and negative. No free air, free fluid or mesenteric
inflammation identified.

Vascular/Lymphatic: Major vascular structures in the abdomen and
pelvis appear to be patent and normal. No lymphadenopathy.

Reproductive: Within normal limits.

Other: Trace free fluid in the cul-de-sac.

Musculoskeletal: Negative.

Review of the MIP images confirms the above findings.
IMPRESSION: 1. Negative for acute pulmonary embolus.
2. No acute or inflammatory process identified in the chest,
abdomen, or pelvis. Normal appendix.
3. Borderline to mild nonspecific mediastinal and hilar
lymphadenopathy is stable since last year, suggesting a benign
etiology.
4. Possible hepatic steatosis.
